# Patient Record
Sex: Female | Born: 1955
Health system: Southern US, Community
[De-identification: ages and names within clinical notes are randomized; demographics above are authoritative.]

## PROBLEM LIST (undated history)

## (undated) DIAGNOSIS — J189 Pneumonia, unspecified organism: Secondary | ICD-10-CM

## (undated) DIAGNOSIS — F32A Depression, unspecified: Secondary | ICD-10-CM

## (undated) DIAGNOSIS — M810 Age-related osteoporosis without current pathological fracture: Secondary | ICD-10-CM

## (undated) DIAGNOSIS — G4700 Insomnia, unspecified: Secondary | ICD-10-CM

## (undated) DIAGNOSIS — I10 Essential (primary) hypertension: Secondary | ICD-10-CM

## (undated) DIAGNOSIS — K429 Umbilical hernia without obstruction or gangrene: Secondary | ICD-10-CM

## (undated) DIAGNOSIS — E559 Vitamin D deficiency, unspecified: Secondary | ICD-10-CM

## (undated) DIAGNOSIS — J439 Emphysema, unspecified: Secondary | ICD-10-CM

## (undated) DIAGNOSIS — E785 Hyperlipidemia, unspecified: Secondary | ICD-10-CM

## (undated) DIAGNOSIS — E669 Obesity, unspecified: Secondary | ICD-10-CM

## (undated) DIAGNOSIS — F418 Other specified anxiety disorders: Secondary | ICD-10-CM

## (undated) DIAGNOSIS — I2510 Atherosclerotic heart disease of native coronary artery without angina pectoris: Secondary | ICD-10-CM

## (undated) DIAGNOSIS — J9611 Chronic respiratory failure with hypoxia: Secondary | ICD-10-CM

## (undated) DIAGNOSIS — J449 Chronic obstructive pulmonary disease, unspecified: Secondary | ICD-10-CM

## (undated) DIAGNOSIS — Z9889 Other specified postprocedural states: Secondary | ICD-10-CM

## (undated) DIAGNOSIS — K219 Gastro-esophageal reflux disease without esophagitis: Secondary | ICD-10-CM

## (undated) DIAGNOSIS — F329 Major depressive disorder, single episode, unspecified: Secondary | ICD-10-CM

## (undated) DIAGNOSIS — K922 Gastrointestinal hemorrhage, unspecified: Secondary | ICD-10-CM

## (undated) DIAGNOSIS — T7840XA Allergy, unspecified, initial encounter: Secondary | ICD-10-CM

## (undated) DIAGNOSIS — M5416 Radiculopathy, lumbar region: Secondary | ICD-10-CM

## (undated) DIAGNOSIS — M1611 Unilateral primary osteoarthritis, right hip: Secondary | ICD-10-CM

## (undated) DIAGNOSIS — K559 Vascular disorder of intestine, unspecified: Secondary | ICD-10-CM

## (undated) DIAGNOSIS — R011 Cardiac murmur, unspecified: Secondary | ICD-10-CM

## (undated) DIAGNOSIS — S5292XA Unspecified fracture of left forearm, initial encounter for closed fracture: Secondary | ICD-10-CM

## (undated) DIAGNOSIS — G4730 Sleep apnea, unspecified: Secondary | ICD-10-CM

## (undated) DIAGNOSIS — S5291XA Unspecified fracture of right forearm, initial encounter for closed fracture: Secondary | ICD-10-CM

## (undated) DIAGNOSIS — IMO0002 Reserved for concepts with insufficient information to code with codable children: Secondary | ICD-10-CM

## (undated) DIAGNOSIS — R112 Nausea with vomiting, unspecified: Secondary | ICD-10-CM

## (undated) HISTORY — DX: Essential (primary) hypertension: I10

## (undated) HISTORY — DX: Obesity, unspecified: E66.9

## (undated) HISTORY — DX: Radiculopathy, lumbar region: M54.16

## (undated) HISTORY — DX: Chronic respiratory failure with hypoxia: J96.11

## (undated) HISTORY — DX: Vitamin D deficiency, unspecified: E55.9

## (undated) HISTORY — PX: ABDOMINAL HYSTERECTOMY: SHX81

## (undated) HISTORY — DX: Atherosclerotic heart disease of native coronary artery without angina pectoris: I25.10

## (undated) HISTORY — DX: Chronic obstructive pulmonary disease, unspecified: J44.9

## (undated) HISTORY — PX: COLONOSCOPY: SHX174

## (undated) HISTORY — DX: Age-related osteoporosis without current pathological fracture: M81.0

## (undated) HISTORY — PX: ESOPHAGOGASTRODUODENOSCOPY: SHX1529

## (undated) HISTORY — DX: Unilateral primary osteoarthritis, right hip: M16.11

## (undated) HISTORY — DX: Allergy, unspecified, initial encounter: T78.40XA

## (undated) HISTORY — PX: BACK SURGERY: SHX140

## (undated) HISTORY — DX: Unspecified fracture of right forearm, initial encounter for closed fracture: S52.91XA

## (undated) HISTORY — DX: Unspecified fracture of left forearm, initial encounter for closed fracture: S52.92XA

## (undated) HISTORY — DX: Hyperlipidemia, unspecified: E78.5

## (undated) HISTORY — DX: Reserved for concepts with insufficient information to code with codable children: IMO0002

## (undated) HISTORY — PX: CHOLECYSTECTOMY: SHX55

## (undated) HISTORY — DX: Emphysema, unspecified: J43.9

## (undated) HISTORY — DX: Umbilical hernia without obstruction or gangrene: K42.9

## (undated) HISTORY — DX: Other specified anxiety disorders: F41.8

## (undated) HISTORY — PX: OTHER SURGICAL HISTORY: SHX169

## (undated) HISTORY — DX: Insomnia, unspecified: G47.00

---

## 1997-12-13 ENCOUNTER — Encounter: Payer: Self-pay | Admitting: Obstetrics and Gynecology

## 1997-12-16 ENCOUNTER — Ambulatory Visit (HOSPITAL_COMMUNITY): Admission: RE | Admit: 1997-12-16 | Discharge: 1997-12-16 | Payer: Self-pay | Admitting: Obstetrics and Gynecology

## 1999-04-21 ENCOUNTER — Encounter: Payer: Self-pay | Admitting: Obstetrics and Gynecology

## 1999-04-24 ENCOUNTER — Encounter (INDEPENDENT_AMBULATORY_CARE_PROVIDER_SITE_OTHER): Payer: Self-pay | Admitting: Specialist

## 1999-04-24 ENCOUNTER — Encounter (INDEPENDENT_AMBULATORY_CARE_PROVIDER_SITE_OTHER): Payer: Self-pay

## 1999-04-24 ENCOUNTER — Inpatient Hospital Stay (HOSPITAL_COMMUNITY): Admission: RE | Admit: 1999-04-24 | Discharge: 1999-04-25 | Payer: Self-pay | Admitting: Obstetrics and Gynecology

## 2001-12-04 ENCOUNTER — Encounter: Payer: Self-pay | Admitting: *Deleted

## 2001-12-04 ENCOUNTER — Ambulatory Visit (HOSPITAL_COMMUNITY): Admission: RE | Admit: 2001-12-04 | Discharge: 2001-12-04 | Payer: Self-pay | Admitting: *Deleted

## 2002-06-07 ENCOUNTER — Emergency Department (HOSPITAL_COMMUNITY): Admission: EM | Admit: 2002-06-07 | Discharge: 2002-06-07 | Payer: Self-pay | Admitting: Emergency Medicine

## 2002-06-07 ENCOUNTER — Encounter: Payer: Self-pay | Admitting: Emergency Medicine

## 2002-06-30 ENCOUNTER — Other Ambulatory Visit: Admission: RE | Admit: 2002-06-30 | Discharge: 2002-06-30 | Payer: Self-pay | Admitting: Obstetrics and Gynecology

## 2003-03-27 ENCOUNTER — Emergency Department (HOSPITAL_COMMUNITY): Admission: EM | Admit: 2003-03-27 | Discharge: 2003-03-27 | Payer: Self-pay | Admitting: Emergency Medicine

## 2003-11-30 ENCOUNTER — Ambulatory Visit: Payer: Self-pay | Admitting: Oncology

## 2004-02-11 ENCOUNTER — Ambulatory Visit (HOSPITAL_COMMUNITY): Admission: RE | Admit: 2004-02-11 | Discharge: 2004-02-11 | Payer: Self-pay | Admitting: Family Medicine

## 2005-05-01 ENCOUNTER — Inpatient Hospital Stay (HOSPITAL_COMMUNITY): Admission: EM | Admit: 2005-05-01 | Discharge: 2005-05-03 | Payer: Self-pay | Admitting: Emergency Medicine

## 2006-05-30 ENCOUNTER — Ambulatory Visit: Payer: Self-pay | Admitting: Internal Medicine

## 2006-07-17 ENCOUNTER — Encounter: Payer: Self-pay | Admitting: Internal Medicine

## 2006-07-17 ENCOUNTER — Ambulatory Visit: Payer: Self-pay | Admitting: Internal Medicine

## 2006-10-31 ENCOUNTER — Other Ambulatory Visit: Admission: RE | Admit: 2006-10-31 | Discharge: 2006-10-31 | Payer: Self-pay | Admitting: Obstetrics and Gynecology

## 2007-04-21 ENCOUNTER — Ambulatory Visit: Payer: Self-pay | Admitting: Internal Medicine

## 2007-04-21 ENCOUNTER — Inpatient Hospital Stay (HOSPITAL_COMMUNITY): Admission: EM | Admit: 2007-04-21 | Discharge: 2007-04-22 | Payer: Self-pay | Admitting: Emergency Medicine

## 2007-04-25 ENCOUNTER — Ambulatory Visit: Payer: Self-pay | Admitting: Internal Medicine

## 2007-05-07 ENCOUNTER — Ambulatory Visit: Payer: Self-pay | Admitting: Internal Medicine

## 2007-07-29 ENCOUNTER — Telehealth: Payer: Self-pay | Admitting: Internal Medicine

## 2010-02-18 ENCOUNTER — Encounter (HOSPITAL_COMMUNITY): Payer: Self-pay | Admitting: Oncology

## 2010-06-13 NOTE — H&P (Signed)
[name], [name]              ACCOUNT NO.:  [id number]   MEDICAL RECORD NO.:  [id number]          PATIENT TYPE:  INP   LOCATION:  1343                         FACILITY:  [facility]   PHYSICIAN:  Therisa Doyne, MD    DATE OF BIRTH:  [birth year]   DATE OF ADMISSION:  04/20/2007  DATE OF DISCHARGE:                              HISTORY & PHYSICAL   PRIMARY CARE PHYSICIAN:  [name], M.D.  Gastroenterologist, Dr.  Juanda Watkins.   CHIEF COMPLAINT:  Abdominal pain.   HISTORY OF PRESENT ILLNESS:  A 55 year old, white female with the past  medical history significant for colitis diagnosed approximately 1 year  ago, hypertension and tobacco abuse who presents to the emergency  department for abdominal pain.  With respect to her colitis diagnosis,  she reports one episode of abdominal pain for which she underwent  colonoscopy which was reportedly negative with the exception of  diverticuli noted.  She had a CT scan which was unremarkable.  It was  felt that this episode of colitis 1 year ago was reasonably related to  ischemic colitis.   The patient presents today with acute, lower quadrant abdominal pain.  This episode was preceded by lightheadedness and dizzy spell.  She then  developed acute left lower quadrant pain and suprapubic pain.  Her pain  has been present x18 hours and is described as a crampy sensation.  She  has had approximately 15-20 bowel movements today, mostly small volume  stools which have all been bloody in nature.  The stool is described as  being bright red blood mixed in with maroon colors.  She denies any sick  contacts and has not been on any recent antibiotics.  She denies fevers,  chills, chest pain, shortness of breath, nausea, vomiting, rashes or  lower extremity edema.   In the emergency department, the patient received normal saline, Zofran,  Dilaudid, Cipro and Flagyl.   PAST MEDICAL HISTORY:  1. Hypertension.  2. Depression.  3. Gastroesophageal  reflux disease.  4. Episode of colitis 1 year ago presumed to be ischemic in etiology.  5. Status post cholecystectomy.  6. Status post abdominal hysterectomy.   SOCIAL HISTORY:  The patient lives in [place], [place].  She  smokes a half pack of cigarettes per day for the past 20-30 years.  She  denies alcohol or drugs.   FAMILY HISTORY:  Negative for inflammatory bowel disease.   REVIEW OF SYSTEMS:  All systems reviewed and negative except for as  mentioned above in the history of present illness.   MEDICATIONS:  1. Zantac 150 mg b.i.d.  2. Nexium 40 mg daily.  3. Altace 10 mg b.i.d.  4. Verapamil 240 mg b.i.d.  5. Zoloft 50 mg daily.   ALLERGIES:  DEMEROL.   PHYSICAL EXAMINATION:  VITAL SIGNS:  Temperature 97.1, blood pressure  121/75, pulse 85, respirations 22.  Oxygen saturation 96% on room air.  GENERAL:  In no acute distress.  HEENT:  Normocephalic, atraumatic.  Pupils equal round and reactive to  light and accommodation.  Extraocular movements intact.  Oropharynx pink  and  moist without any lesions.  NECK:  Supple.  No lymphadenopathy.  No jugular venous distention.  No  masses.  CARDIAC:  Regular rate and rhythm.  No murmurs, rubs or gallops.  CHEST:  Clear to auscultation bilaterally.  ABDOMEN:  Decreased bowel sounds.  Tenderness to palpation in the left  lower quadrant.  No rebound or guarding.  No hepatosplenomegaly.  EXTREMITIES:  No clubbing, cyanosis or edema.  Dorsalis pedis pulses are  2+ bilaterally.  SKIN:  No rashes.  BACK:  No CVA tenderness.   LABORATORY DATA AND X-RAY FINDINGS:  White blood cell count 21.4 with a  left shift of 84% segmented neutrophils and 11% lymphocytes, hemoglobin  14.2, platelets 376.  BMP within normal limits.   CT scan of the abdomen and pelvis with left-sided colitis, nonspecific  in nature, however, the differential does include ischemia.  A fatty  liver was also seen.   ASSESSMENT/PLAN:  1. Will admit the patient  to [facility] service under Dr.      Aurelio Watkins care.  2. Left-sided colitis, differential includes infectious, inflammatory      and ischemia.  I question if ischemia is most likely given her      history of tobacco abuse and the fact that she is on maximum dose      of two antihypertensive medications.  She may have had a preceding      episode of transient hypotension which would account for her      dizziness and subsequently could have had ischemic colitis.  We      will give her supportive care with normal saline at 125 mL per hour      and place her on a liquid diet.  She is on empiric ciprofloxacin      and Flagyl intravenously.  Will check stool cultures to include      Clostridium difficile toxin, stool cell count, stool culture and      stool ova and parasite examination.  Will monitor the patient with      serial abdominal exam to consider gastrointestinal consultation for      possible colonoscopy.  Symptomatic treatment with morphine and      Phenergan.  3. Hypertension.  Currently, her systolic blood pressure is 110.  Will      hold her Verapamil and Altace for now as her colitis may have been      from a flow state which could have caused ischemic colitis.  4. Gastroesophageal reflux disease.  Continue Nexium and Zantac.  5. Depression.  Continue Zoloft.  6. Fluids, electrolytes and nutrition.  Normal saline at 125 mL per      hour.  Electrolytes are stable.  Liquid diet.  7. Deep venous thrombosis prophylaxis.  Bilateral pneumatic      compression hoses.     Therisa Doyne, MD  Electronically Signed    SJT/MEDQ  D:  04/21/2007  T:  04/21/2007  Job:  [id number]

## 2010-06-13 NOTE — Assessment & Plan Note (Signed)
 HEALTHCARE                         GASTROENTEROLOGY OFFICE NOTE   NAME:[name], [name]                  MRN:          [id number]  DATE:05/07/2007                            DOB:          18-Mar-1955    [name] is a 55 year old white female who is here for followup of  ischemic colitis.  She presented to the hospital on April 20, 2007 with  severe lower abdominal pain across the lower abdomen with past history  of blood per rectum and CT scan evidence of acute colitis.  The patient  has been having similar attacks on a periodic basis.  She is a smoker,  and she is on 2 antihypertensive medications.  Her ischemic colitis was  attributed to a low-flow state.  She, since then, has been much  improved, taking stool softeners, but she still has residual abdominal  discomfort in lower abdomen.  The attacks of the abdominal pain are so  severe at times that she almost passes out.  Levsin sublingually 0.125  mg which she took from her son seemed to help the pain.  She had to take  3 of them.   MEDICATIONS:  1. Verapamil 240 mg 1 p.o. b.i.d.  2. Zantac OTC 150 mg daily.  3. Levsin 0.125 mg p.r.n.  4. Nexium 40 mg daily.  5. Zoloft 50 mg daily.  6. Altace 10 mg p.o. b.i.d.   PHYSICAL EXAMINATION:  Blood pressure 118/78, pulse 68 and weight 164  pounds.  She was alert, oriented, no distress.  Lungs were clear to auscultation.  Cor with normal S1 and S2.  Abdomen was soft, mildly protuberant with horizontal scar in suprapubic  area from previous hysterectomy.  There was marked tenderness across the  lower abdomen from left to right lower quadrant without any rebound.  There was no fullness or induration.  Bowel sounds were normoactive.  Liver edge at costal margin.   IMPRESSION:  This is a 55 year old white female with history of  intermittent lower abdominal pain, constipation, and evidence of  suggestive ischemic colitis in the left colon.   Last colonoscopy in June  2008 did not show any evidence of acute colitis.  She had mild  diverticulosis of the left colon and diminutive polyp of the rectum  which were removed.   PLAN:  1. Continue Levsin 0.125 mg up to 2 at a time for intermittent      abdominal pain.  2. Stool softeners on a daily basis to prevent constipation.  3. Consider cutting back on some of her antihypertensive medications.  4. Align as a probiotic 1 p.o. daily, samples given.  5. Vicodin, generic, dispense 30, half to 1 tablet q.6 h. p.r.n.      severe lower abdominal pain.  I need to see her again in 3 months.     Hedwig Morton. Juanda Chance, MD  Electronically Signed    DMB/MedQ  DD: 05/07/2007  DT: 05/07/2007  Job #: [id number]   cc:   Dario Guardian, M.D.

## 2010-06-16 NOTE — Assessment & Plan Note (Signed)
[facility]                         GASTROENTEROLOGY OFFICE NOTE   NAME:[name], [name]                  MRN:          [id number]  DATE:05/30/2006                            DOB:          1955-06-21    [name] is a very nice 55 year old white female who comes for  evaluation of non-specific colitis.  On CT of the abdomen done in  December 2007, she had edema of the left colon suggestive of infectious,  colitis or inflammatory bowel disease.  She comes because of episodic  severe cramping abdominal pain localized to the left lower quadrant.  She had a severe episode in December and then another episode several  days ago.  With the first episode, there was some bleeding and passed  mucous.  She had a cramping abdominal pain with multiple stools of  watery consistency, with her usual bowel habits being one bowel movement  daily.  She has a history of irritable bowel syndrome and used to have  predominantly diarrhea, but most recently, more constipation.  We have  seen her in the past on multiple occasions for colonoscopy because of  positive family history of colon cancer in  her father.  Her father also  has some sort of colitis.  We did a colonoscopy in 1990, in 1991 when  she had colon polyps, subsequently in 1995 and the last colonoscopy in  August 2001.  She had adenomatous polyps in the past but not on the last  colonoscopy.  There was diverticulosis of the left colon with spasms.  On the last colonoscopy, the patient was started on Colace, and Perdiem  with Senokot 2 teaspoons daily for constipation.  Another problem has  been heartburn.  The patient is a smoker.  She is burning substernally  and was evaluated for her chest pain by cardiology.  She continues to  smoke, drinks only decaffeinated beverages.   MEDICATIONS:  1. Altace 10 mg b.i.d..  2. Verapamil 240 one b.i.d.  3. Zantac 150 mg four time   PAST MEDICAL HISTORY:  1.  Significant for hyperlipidemia.  2. High blood pressure.  3. Questionable angina.  4. Arthritic complaints.  5. Depression.  6. Blood clotting disease.  7. Cholecystectomy.  8. Hysterectomy.  9. Appendectomy.   FAMILY HISTORY:  Positive for colon cancer in her father and heart  disease in her father.   SOCIAL HISTORY:  Married with two children.  She works as a Programmer, applications.  She smokes.  Does not drink alcohol.   REVIEW OF SYSTEMS:  1. Positive for allergies.  2. Frequent cough.  3. Arthritic complaints.  4. Skin rashes.  5. Fainting spells.  6. Vision changes.   PHYSICAL EXAMINATION:  Blood pressure 108/72, pulse 80, weight 166  pounds.  The patient was alert, oriented, in no distress.  Oral cavity  was normal.  NECK:  Supple.  No lymphadenopathy.  LUNGS:  Clear to auscultation.  COR:  Normal S1, normal S2.  ABDOMEN:  Soft with tenderness along the left lower quadrant and left  middle quadrants with no rebound, no papular mass.  Right low  quadrant  was also somewhat tender but not as much as the left side. Liver edge  was at costal margin post cholecystectomy.  Scars present.  RECTAL EXAMINATION:  Revealed trace Hemoccult-positive stool.   IMPRESSION:  A 55 year old white female with episodic attacks of  abdominal pain and family history of colon cancer.  Her last colonoscopy  showed mild diverticulosis of the left colon, but previous colonoscopy  showed adenomatous polyps.  The Hemoccult-positive stool could be  related to recent diarrhea  due to rectal irritation. In the setting of  increased risk factors for colon cancer and acute abdominal pain, we  need to proceed with colonoscopy.   PLAN:  1. Colonoscopy scheduled.  2. Samples of Nexium 40 mg p.o. b.i.d.  The patient may need upper      endoscopy as well to rule out Barrett's  esophagus.  3. Levsin sublingually 0.125 mg to take twice a day.     Hedwig Morton. Juanda Chance, MD  Electronically Signed    DMB/MedQ   DD: 05/30/2006  DT: 05/30/2006  Job #: [id number]   cc:   Dario Guardian, M.D.

## 2010-06-16 NOTE — H&P (Signed)
[name], [name]              ACCOUNT NO.:  [id number]   MEDICAL RECORD NO.:  [id number]          PATIENT TYPE:  EMS   LOCATION:  ED                           FACILITY:  [facility]   PHYSICIAN:  Deirdre Peer. Polite, M.D. DATE OF BIRTH:  31-Mar-1955   DATE OF ADMISSION:  05/01/2005  DATE OF DISCHARGE:                                HISTORY & PHYSICAL   CHIEF COMPLAINT:  Nausea and vomiting.   HISTORY OF PRESENT ILLNESS:  A 55 year old female with known history of  COPD, hypertension, depression, who was sent to the ED for evaluation of a  prolonged illness characterized by nausea, vomiting, and diarrhea.  According to the patient, after a few days stress test she had above  symptoms, multiple bouts of nausea, vomiting and diarrhea.  Did note  occasional blood-tinged emesis.  Of note, the patient does have a history of  peptic ulcer disease and has been on PPI; however, she has not been able to  take it over the last couple of days because of her persistent nausea and  vomiting.  She has not been able to keep much on her stomach because of the  above symptoms.  She denies any recent antibiotics.  Does admit to having a  sick contact, her grandson, who had a GI illness. However, no one else is  sick at home.  The patient does have well water at home but, again, no one  else is sick.  Of note, the patient did have a bout with C. difficile  approximately 1 year ago when she had similar symptoms that she is  describing today.  At that time the patient had been on a course of  antibiotics.  The patient does admit to some subjective fevers, chills, and  some significant abdominal discomfort.  She does also admit to occasional  productive cough.  Denies any dysuria.  In the ED the patient was evaluated.  She was afebrile, hemodynamically stable.  Most of her labs are pending.  However, CBC does show a white count of 15,000.  Abdominal series shows  focal areas of ileus.  Chest x-ray without  infiltrate.  Admission is deemed  necessary for further evaluation and treatment.   PAST MEDICAL HISTORY:  As stated above, significant for:  1.  Hypertension.  2.  COPD.  3.  Depression.  4.  Recent evaluation for chest pain which revealed a negative stress test.  5.  History of peptic ulcer disease.  6.  History of irritable bowel syndrome characterized by diarrhea and      constipation.   MEDICATIONS ON ADMISSION:  Include Altace, verapamil, Paxil, Nexium.   SOCIAL HISTORY:  Positive for tobacco, one pack per day.  No alcohol, no  drugs.   PAST SURGICAL HISTORY:  Significant for hysterectomy approximately 4-5 years  ago secondary to a fibroid.  The patient admits to having cholecystectomy at  age 71.   ALLERGIES:  Reports DEMEROL causes nausea and vomiting.   FAMILY HISTORY:  Mother deceased from complications of lupus.  Father has  history of coronary disease and brother with cardiomyopathy.  REVIEW OF SYSTEMS:  As stated in the HPI.   PHYSICAL EXAMINATION:  GENERAL:  The patient is alert and oriented x3 in  mild to moderate distress secondary to abdominal discomfort.  VITAL SIGNS:  Temperature 97.4, blood pressure 151/105, pulse 99,  respiratory rate of 18.  HEENT:  Anicteric sclerae, moist oral mucosa.  CHEST:  Clear to auscultation bilaterally.  CARDIOVASCULAR:  Regular, no S3.  ABDOMEN:  Somewhat diffusely tender; however, appears to be more tender in  the periumbilical area.  EXTREMITIES:  No clubbing, cyanosis, or edema; 2+ pulse.  NEUROLOGIC:  Nonfocal.   ASSESSMENT:  1.  Nausea, vomiting, diarrhea for approximately 2-3 weeks.  Differential      diagnosis includes viral gastroenteritis versus peptic ulcer disease      versus infectious enteritis versus ileus.  2.  Fever at home.  3.  Chronic obstructive pulmonary disease.  4.  Hypertension.  5.  Depression.  6.  Leukocytosis of 15,000.  7.  Abnormal x-ray showing areas of focal ileus.  8.  History of  peptic ulcer disease.  9.  Recent evaluation for chest pain with a normal stress test.   Recommend the patient be admitted to a medicine floor bed.  The patient will  be provided with IV fluids.  Will obtain labs, CMET, CBC.  Because x-ray  shows focal areas of ileus will also obtain a CT.  The patient will be  pancultured, started on empiric antibiotics, placed on PPI and DVT  prophylaxis.  Will also obtain orthostatic vital signs.  Will make further  recommendations after review of the above studies.      Deirdre Peer. Polite, M.D.  Electronically Signed     RDP/MEDQ  D:  05/01/2005  T:  05/01/2005  Job:  [id number]   cc:   Dario Guardian, M.D.  Fax: [phone]

## 2010-10-24 LAB — CBC
HCT: 35.1 — ABNORMAL LOW
HCT: 40.7
Hemoglobin: 12.1
Hemoglobin: 14.2
MCHC: 35.1
MCV: 90.8
MCV: 90.8
MCV: 90.9
Platelets: 310
Platelets: 376
RBC: 3.65 — ABNORMAL LOW
RBC: 3.86 — ABNORMAL LOW
RDW: 13
RDW: 13
WBC: 11.6 — ABNORMAL HIGH
WBC: 21.4 — ABNORMAL HIGH

## 2010-10-24 LAB — LUPUS ANTICOAGULANT PANEL
DRVVT: 46.5 (ref 36.1–47.0)
Lupus Anticoagulant: NOT DETECTED
PTTLA 4:1 Mix: 52.8 — ABNORMAL HIGH (ref 36.3–48.8)
PTTLA Confirmation: 10.6 — ABNORMAL HIGH (ref <8.0)

## 2010-10-24 LAB — COMPREHENSIVE METABOLIC PANEL
AST: 19
CO2: 27
Calcium: 7.9 — ABNORMAL LOW
Creatinine, Ser: 0.74
GFR calc Af Amer: >60
GFR calc non Af Amer: >60
Glucose, Bld: 90
Total Protein: 5.3 — ABNORMAL LOW

## 2010-10-24 LAB — OVA AND PARASITE EXAMINATION

## 2010-10-24 LAB — URINALYSIS, ROUTINE W REFLEX MICROSCOPIC
Glucose, UA: NEGATIVE
Hgb urine dipstick: NEGATIVE
Protein, ur: NEGATIVE
Specific Gravity, Urine: 1.026
Urobilinogen, UA: 0.2
pH: 5.5

## 2010-10-24 LAB — STOOL CULTURE

## 2010-10-24 LAB — BASIC METABOLIC PANEL
Calcium: 8.9
Creatinine, Ser: 0.72
GFR calc non Af Amer: >60

## 2010-10-24 LAB — DIFFERENTIAL
Lymphocytes Relative: 11 — ABNORMAL LOW
Monocytes Absolute: 1.1 — ABNORMAL HIGH
Neutro Abs: 18 — ABNORMAL HIGH

## 2010-10-24 LAB — PROTEIN S ACTIVITY: Protein S Activity: 80 % (ref 69–129)

## 2010-10-24 LAB — CARDIOLIPIN ANTIBODIES, IGG, IGM, IGA
Anticardiolipin IgA: 19 (ref <13)
Anticardiolipin IgG: 12 (ref <11)

## 2010-10-24 LAB — CLOSTRIDIUM DIFFICILE EIA: C difficile Toxins A+B, EIA: NEGATIVE

## 2010-10-24 LAB — PROTEIN C, TOTAL: Protein C, Total: 85 % (ref 70–140)

## 2010-10-24 LAB — PROTEIN S, TOTAL: Protein S Ag, Total: 93 % (ref 70–140)

## 2011-04-12 ENCOUNTER — Encounter: Payer: Self-pay | Admitting: Internal Medicine

## 2012-05-02 ENCOUNTER — Encounter: Payer: Self-pay | Admitting: Internal Medicine

## 2012-11-04 ENCOUNTER — Ambulatory Visit (INDEPENDENT_AMBULATORY_CARE_PROVIDER_SITE_OTHER): Payer: PRIVATE HEALTH INSURANCE | Admitting: Podiatrist

## 2012-11-04 ENCOUNTER — Encounter: Payer: Self-pay | Admitting: Podiatrist

## 2012-11-04 VITALS — BP 101/55 | HR 76 | Ht 64.0 in | Wt 192.0 lb

## 2012-11-04 DIAGNOSIS — L600 Ingrowing nail: Secondary | ICD-10-CM

## 2012-11-04 NOTE — Progress Notes (Signed)
  Chief Complaint  Patient presents with  . Ingrown Toenail    left big toe     HPI: Patient is 57 y.o. female who presents today for painful left great toenail medial border.  Patient's husband tries to trim it out but it has recently become swollen, tender, and sore.  Patient states it did have pus however that has resolved.   Review of Systems  DATA OBTAINED: from patient GENERAL: Feels well no fevers, no fatigue, no changes in appetite SKIN: No itching, no rashes, no open lesions, no wounds EYES: No eye pain,no redness, no discharge EARS: No earache,no ringing of ears, no recent change in hearing NOSE: No congestion, no drainage, no bleeding  MOUTH/THROAT: No mouth pain, No sore throat, No difficulty chewing or swallowing  RESPIRATORY: No cough, no wheezing, no SOB CARDIAC: No chest pain,no heart palpitations,no new onset lower extremity edema  GI: No abdominal pain, No Nausea, no vomiting, no diarrhea, no heartburn or no reflux  GU: No dysuria, no increased frequency or urgency MUSCULOSKELETAL: No unrelieved bone/joint pain,  NEUROLOGIC: Awake, alert, appropriate to situation, No change in mental status. PSYCHIATRIC: No overt anxiety or sadness.No behavior issue.  AMBULATION:  Ambulates unassisted    Physical Exam  GENERAL APPEARANCE: Alert, conversant. Appropriately groomed. No acute distress.  VASCULAR: Pedal pulses palpable and strong bilateral.  Capillary refill time is immediate to all digits,  Proximal to distal cooling it warm to warm.  Digital hair growth is present bilateral  NEUROLOGIC: sensation is intact epicritically and protectively to 5.07 monofilament at 5/5 sites bilateral.  Light touch is intact bilateral, vibratory sensation intact bilateral, achilles tendon reflex is intact bilateral.  MUSCULOSKELETAL: acceptable muscle strength, tone and stability bilateral.  Intrinsic muscluature intact bilateral.  Rectus appearance of foot and digits noted bilateral.    DERMATOLOGIC: skin color, texture, and turger are within normal limits.  No preulcerative lesions are seen, no interdigital maceration noted.  No open lesions present.  Digital nails are asymptomatic. With the exception of the left hallux nail which has a pincer type nail deformity present.  Minimal redness along medial nail border is seen.  No drainange or pus seen.   Assessment   Ingrown Nail - Left first Medial border  Plan  Treatment options and alternatives discussed.  Recommended permanent phenol matrixectomy and patient agreed left was prepped with alcohol and a 1 to 1 mix of 0.5% marcaine plain and 2% lidocaine plain was administered in a digital block fashion.  The toe was then prepped with betadine solution and exsanguinated.  The offending nail border was then excised and matrix tissue exposed.  Phenol was then applied to the matrix tissue followed by an alcohol wash.  Antibiotic ointment and a dry sterile dressing was applied.  The patient was dispensed instructions for aftercare.    Delories Heinz, DPM

## 2012-11-04 NOTE — Patient Instructions (Addendum)
ANTIBACTERIAL SOAP INSTRUCTIONS  THE DAY AFTER PROCEDURE  Please follow the instructions your doctor has marked.   Shower as usual. Before getting out, place a drop of antibacterial liquid soap (Dial) on a wet, clean washcloth.  Gently wipe washcloth over affected area.  Afterward, rinse the area with warm water.  Blot the area dry with a soft cloth and cover with antibiotic ointment (neosporin, polysporin, bacitracin) and band aid or gauze and tape  OR    Place 3-4 drops of antibacterial liquid soap in a quart of warm tap water.  Submerge foot into water for 20 minutes.  If bandage was applied after your procedure, leave on to allow for easy lift off, then remove and continue with soak for the remaining time.  Next, blot area dry with a soft cloth and cover with a bandage.  Apply other medications as directed by your doctor, such as cortisporin otic solution (eardrops) or neosporin antibiotic ointment   Call if nail becomes red, swollen or draining pus or if you have moderate to severe pain after the procedure

## 2012-11-25 ENCOUNTER — Ambulatory Visit: Payer: PRIVATE HEALTH INSURANCE | Admitting: Podiatrist

## 2012-12-02 ENCOUNTER — Ambulatory Visit: Payer: PRIVATE HEALTH INSURANCE | Admitting: Podiatrist

## 2013-01-07 ENCOUNTER — Encounter: Payer: Self-pay | Admitting: Internal Medicine

## 2013-03-21 ENCOUNTER — Emergency Department (HOSPITAL_COMMUNITY): Payer: PRIVATE HEALTH INSURANCE

## 2013-03-21 ENCOUNTER — Inpatient Hospital Stay (HOSPITAL_COMMUNITY)
Admission: EM | Admit: 2013-03-21 | Discharge: 2013-03-23 | DRG: 395 | Disposition: A | Discharge status: Home / Self Care | Payer: PRIVATE HEALTH INSURANCE | Attending: Family Medicine | Admitting: Family Medicine

## 2013-03-21 ENCOUNTER — Encounter (HOSPITAL_COMMUNITY): Payer: Self-pay | Admitting: Emergency Medicine

## 2013-03-21 DIAGNOSIS — F17200 Nicotine dependence, unspecified, uncomplicated: Secondary | ICD-10-CM | POA: Diagnosis present

## 2013-03-21 DIAGNOSIS — Z833 Family history of diabetes mellitus: Secondary | ICD-10-CM

## 2013-03-21 DIAGNOSIS — K55059 Acute (reversible) ischemia of intestine, part and extent unspecified: Principal | ICD-10-CM | POA: Diagnosis present

## 2013-03-21 DIAGNOSIS — K559 Vascular disorder of intestine, unspecified: Secondary | ICD-10-CM

## 2013-03-21 DIAGNOSIS — Z8249 Family history of ischemic heart disease and other diseases of the circulatory system: Secondary | ICD-10-CM

## 2013-03-21 DIAGNOSIS — K551 Chronic vascular disorders of intestine: Secondary | ICD-10-CM | POA: Diagnosis present

## 2013-03-21 DIAGNOSIS — Z888 Allergy status to other drugs, medicaments and biological substances status: Secondary | ICD-10-CM

## 2013-03-21 DIAGNOSIS — Z79899 Other long term (current) drug therapy: Secondary | ICD-10-CM

## 2013-03-21 DIAGNOSIS — J438 Other emphysema: Secondary | ICD-10-CM | POA: Diagnosis present

## 2013-03-21 DIAGNOSIS — I10 Essential (primary) hypertension: Secondary | ICD-10-CM | POA: Diagnosis present

## 2013-03-21 DIAGNOSIS — R7989 Other specified abnormal findings of blood chemistry: Secondary | ICD-10-CM

## 2013-03-21 DIAGNOSIS — E785 Hyperlipidemia, unspecified: Secondary | ICD-10-CM | POA: Diagnosis present

## 2013-03-21 DIAGNOSIS — E872 Acidosis, unspecified: Secondary | ICD-10-CM

## 2013-03-21 DIAGNOSIS — R55 Syncope and collapse: Secondary | ICD-10-CM

## 2013-03-21 DIAGNOSIS — Z823 Family history of stroke: Secondary | ICD-10-CM

## 2013-03-21 DIAGNOSIS — K219 Gastro-esophageal reflux disease without esophagitis: Secondary | ICD-10-CM | POA: Diagnosis present

## 2013-03-21 DIAGNOSIS — R109 Unspecified abdominal pain: Secondary | ICD-10-CM

## 2013-03-21 DIAGNOSIS — F3289 Other specified depressive episodes: Secondary | ICD-10-CM | POA: Diagnosis present

## 2013-03-21 DIAGNOSIS — Z800 Family history of malignant neoplasm of digestive organs: Secondary | ICD-10-CM

## 2013-03-21 DIAGNOSIS — F329 Major depressive disorder, single episode, unspecified: Secondary | ICD-10-CM | POA: Diagnosis present

## 2013-03-21 HISTORY — DX: Vascular disorder of intestine, unspecified: K55.9

## 2013-03-21 LAB — HEPATIC FUNCTION PANEL
ALBUMIN: 3.7 g/dL (ref 3.5–5.2)
ALK PHOS: 101 U/L (ref 39–117)
ALT: 71 U/L — AB (ref 0–35)
AST: 44 U/L — AB (ref 0–37)
BILIRUBIN TOTAL: 0.4 mg/dL (ref 0.3–1.2)
Total Protein: 6.8 g/dL (ref 6.0–8.3)

## 2013-03-21 LAB — I-STAT CHEM 8, ED
BUN: 18 mg/dL (ref 6–23)
CHLORIDE: 104 meq/L (ref 96–112)
CREATININE: 1 mg/dL (ref 0.50–1.10)
Calcium, Ion: 1.23 mmol/L (ref 1.12–1.23)
GLUCOSE: 110 mg/dL — AB (ref 70–99)
HCT: 49 % — ABNORMAL HIGH (ref 36.0–46.0)
HEMOGLOBIN: 16.7 g/dL — AB (ref 12.0–15.0)
POTASSIUM: 4.1 meq/L (ref 3.7–5.3)
Sodium: 142 mEq/L (ref 137–147)
TCO2: 28 mmol/L (ref 0–100)

## 2013-03-21 LAB — URINALYSIS, ROUTINE W REFLEX MICROSCOPIC
BILIRUBIN URINE: NEGATIVE
GLUCOSE, UA: NEGATIVE mg/dL
HGB URINE DIPSTICK: NEGATIVE
KETONES UR: NEGATIVE mg/dL
Leukocytes, UA: NEGATIVE
Nitrite: NEGATIVE
PH: 5 (ref 5.0–8.0)
Protein, ur: NEGATIVE mg/dL
SPECIFIC GRAVITY, URINE: 1.019 (ref 1.005–1.030)
Urobilinogen, UA: 1 mg/dL (ref 0.0–1.0)

## 2013-03-21 LAB — I-STAT TROPONIN, ED
TROPONIN I, POC: 0.01 ng/mL (ref 0.00–0.08)
Troponin i, poc: 0 ng/mL (ref 0.00–0.08)

## 2013-03-21 LAB — POC OCCULT BLOOD, ED: FECAL OCCULT BLD: NEGATIVE

## 2013-03-21 LAB — LIPASE, BLOOD: Lipase: 21 U/L (ref 11–59)

## 2013-03-21 LAB — I-STAT CG4 LACTIC ACID, ED: Lactic Acid, Venous: 3.41 mmol/L — ABNORMAL HIGH (ref 0.5–2.2)

## 2013-03-21 MED ORDER — MORPHINE SULFATE 4 MG/ML IJ SOLN
4.0000 mg | Freq: Once | INTRAMUSCULAR | Status: AC
  Administered 2013-03-21: 4 mg via INTRAVENOUS
  Filled 2013-03-21: qty 1

## 2013-03-21 MED ORDER — HEPARIN SODIUM (PORCINE) 5000 UNIT/ML IJ SOLN
5000.0000 [IU] | Freq: Three times a day (TID) | INTRAMUSCULAR | Status: DC
  Administered 2013-03-21 – 2013-03-22 (×2): 5000 [IU] via SUBCUTANEOUS
  Filled 2013-03-21 (×5): qty 1

## 2013-03-21 MED ORDER — SODIUM CHLORIDE 0.9 % IJ SOLN
3.0000 mL | Freq: Two times a day (BID) | INTRAMUSCULAR | Status: DC
  Administered 2013-03-21 – 2013-03-22 (×2): 3 mL via INTRAVENOUS

## 2013-03-21 MED ORDER — ONDANSETRON HCL 4 MG/2ML IJ SOLN
4.0000 mg | Freq: Once | INTRAMUSCULAR | Status: AC
  Administered 2013-03-21: 4 mg via INTRAVENOUS
  Filled 2013-03-21: qty 2

## 2013-03-21 MED ORDER — CIPROFLOXACIN IN D5W 400 MG/200ML IV SOLN
400.0000 mg | Freq: Two times a day (BID) | INTRAVENOUS | Status: DC
  Administered 2013-03-22 – 2013-03-23 (×4): 400 mg via INTRAVENOUS
  Filled 2013-03-21 (×5): qty 200

## 2013-03-21 MED ORDER — PANTOPRAZOLE SODIUM 40 MG PO TBEC
40.0000 mg | DELAYED_RELEASE_TABLET | Freq: Every day | ORAL | Status: DC
  Administered 2013-03-22 – 2013-03-23 (×2): 40 mg via ORAL
  Filled 2013-03-21 (×2): qty 1

## 2013-03-21 MED ORDER — ATORVASTATIN CALCIUM 40 MG PO TABS
40.0000 mg | ORAL_TABLET | Freq: Every day | ORAL | Status: DC
Start: 2013-03-22 — End: 2013-03-23
  Administered 2013-03-22: 40 mg via ORAL
  Filled 2013-03-21 (×2): qty 1

## 2013-03-21 MED ORDER — ONDANSETRON HCL 4 MG/2ML IJ SOLN: 4.0000 mg | Freq: Three times a day (TID) | INTRAMUSCULAR | Status: DC | PRN

## 2013-03-21 MED ORDER — ALBUTEROL SULFATE (2.5 MG/3ML) 0.083% IN NEBU
5.0000 mg | INHALATION_SOLUTION | Freq: Four times a day (QID) | RESPIRATORY_TRACT | Status: DC | PRN
  Administered 2013-03-22: 5 mg via RESPIRATORY_TRACT
  Filled 2013-03-21: qty 6

## 2013-03-21 MED ORDER — ZOLPIDEM TARTRATE 5 MG PO TABS
5.0000 mg | ORAL_TABLET | Freq: Every evening | ORAL | Status: DC | PRN
  Administered 2013-03-21 – 2013-03-22 (×2): 5 mg via ORAL
  Filled 2013-03-21 (×2): qty 1

## 2013-03-21 MED ORDER — METRONIDAZOLE IN NACL 5-0.79 MG/ML-% IV SOLN
500.0000 mg | Freq: Three times a day (TID) | INTRAVENOUS | Status: DC
  Administered 2013-03-21 – 2013-03-23 (×5): 500 mg via INTRAVENOUS
  Filled 2013-03-21 (×7): qty 100

## 2013-03-21 MED ORDER — ACETAMINOPHEN 650 MG RE SUPP: 650.0000 mg | Freq: Four times a day (QID) | RECTAL | Status: DC | PRN

## 2013-03-21 MED ORDER — CITALOPRAM HYDROBROMIDE 20 MG PO TABS
20.0000 mg | ORAL_TABLET | Freq: Every day | ORAL | Status: DC
  Administered 2013-03-22 – 2013-03-23 (×2): 20 mg via ORAL
  Filled 2013-03-21 (×2): qty 1

## 2013-03-21 MED ORDER — SODIUM CHLORIDE 0.9 % IV BOLUS (SEPSIS)
500.0000 mL | Freq: Once | INTRAVENOUS | Status: AC
  Administered 2013-03-21: 500 mL via INTRAVENOUS

## 2013-03-21 MED ORDER — IOHEXOL 300 MG/ML  SOLN
100.0000 mL | Freq: Once | INTRAMUSCULAR | Status: AC | PRN
  Administered 2013-03-21: 100 mL via INTRAVENOUS

## 2013-03-21 MED ORDER — SODIUM CHLORIDE 0.9 % IV BOLUS (SEPSIS)
1000.0000 mL | Freq: Once | INTRAVENOUS | Status: AC
  Administered 2013-03-21: 1000 mL via INTRAVENOUS

## 2013-03-21 MED ORDER — ACETAMINOPHEN 325 MG PO TABS: 650.0000 mg | ORAL_TABLET | Freq: Four times a day (QID) | ORAL | Status: DC | PRN

## 2013-03-21 MED ORDER — MORPHINE SULFATE 2 MG/ML IJ SOLN
1.0000 mg | INTRAMUSCULAR | Status: DC | PRN
  Administered 2013-03-21 – 2013-03-22 (×3): 1 mg via INTRAVENOUS
  Filled 2013-03-21 (×3): qty 1

## 2013-03-21 MED ORDER — DEXTROSE-NACL 5-0.9 % IV SOLN
INTRAVENOUS | Status: DC
  Administered 2013-03-21: 125 mL/h via INTRAVENOUS

## 2013-03-21 NOTE — ED Notes (Signed)
Admitting MD at bedside.

## 2013-03-21 NOTE — ED Notes (Signed)
Dr.Arrieta given results of Istat Lactic Acid POC. ED-Lab

## 2013-03-21 NOTE — ED Provider Notes (Signed)
CSN: [id number]     Arrival date & time 03/21/13  1619 History   First MD Initiated Contact with Patient 03/21/13 1626     Chief Complaint  Patient presents with  . Loss of Consciousness  . Hypotension      Patient is a 58 y.o. female presenting with syncope.  Loss of Consciousness Episode history:  Single Most recent episode:  Today Duration: unknown. Timing:  Constant Progression:  Resolved Chronicity:  New Context: bowel movement   Context: not blood draw, not dehydration, not exertion, not inactivity, not medication change, not with normal activity, not sight of blood, not sitting down, not standing up and not urination   Witnessed: no   Relieved by:  None tried Worsened by:  Nothing tried Ineffective treatments:  None tried Associated symptoms: nausea   Associated symptoms: no anxiety, no chest pain, no confusion, no diaphoresis, no difficulty breathing, no dizziness, no fever, no focal sensory loss, no focal weakness, no headaches, no malaise/fatigue, no palpitations, no recent fall, no recent injury, no recent surgery, no rectal bleeding, no seizures, no shortness of breath, no visual change, no vomiting and no weakness       Past Medical History  Diagnosis Date  . Allergy     sinus problems  . Osteoporosis   . Intentional weight change   . Hypertension   . Emphysema of lung   . Ischemic colitis    Past Surgical History  Procedure Laterality Date  . Abdominal hysterectomy    . Back surgery      c-3 and c-4   Family History  Problem Relation Age of Onset  . Lupus Mother   . Diabetes Father   . Hypertension Father   . Hypertension Brother   . Colon cancer Paternal Grandfather   . Stroke Paternal Grandfather    History  Substance Use Topics  . Smoking status: Current Every Day Smoker  . Smokeless tobacco: Never Used     Comment: uses a vapor cigarette  . Alcohol Use: No   OB History   Grav Para Term Preterm Abortions TAB SAB Ect Mult Living                  Review of Systems  Constitutional: Negative for fever, malaise/fatigue and diaphoresis.  Respiratory: Negative for shortness of breath.   Cardiovascular: Positive for syncope. Negative for chest pain and palpitations.  Gastrointestinal: Positive for nausea and abdominal pain. Negative for vomiting.  Neurological: Positive for syncope. Negative for dizziness, focal weakness, seizures, weakness and headaches.  Psychiatric/Behavioral: Negative for confusion.      Allergies  Demerol  Home Medications   Current Outpatient Rx  Name  Route  Sig  Dispense  Refill  . albuterol (PROVENTIL) (2.5 MG/3ML) 0.083% nebulizer solution   Nebulization   Take 2.5 mg by nebulization every 6 (six) hours as needed for wheezing.         . Ascorbic Acid (VITAMIN C) 1000 MG tablet   Oral   Take 1,000 mg by mouth daily.         . Calcium Carb-Cholecalciferol (CALCIUM 600 + D) 600-200 MG-UNIT TABS   Oral   Take 1 tablet by mouth daily.         . citalopram (CELEXA) 20 MG tablet   Oral   Take 20 mg by mouth daily.         Marland Kitchen esomeprazole (NEXIUM) 40 MG capsule   Oral   Take 40 mg by mouth  daily at 12 noon.         . fish oil-omega-3 fatty acids 1000 MG capsule   Oral   Take 2 g by mouth daily.         Marland Kitchen. HYDROcodone-acetaminophen (NORCO/VICODIN) 5-325 MG per tablet   Oral   Take 1 tablet by mouth every 6 (six) hours as needed for moderate pain (abdominal pain).         . metoprolol (LOPRESSOR) 50 MG tablet   Oral   Take 50 mg by mouth 2 (two) times daily.         . nitroGLYCERIN (NITROSTAT) 0.4 MG SL tablet   Sublingual   Place 0.4 mg under the tongue every 5 (five) minutes as needed for chest pain.         . ramipril (ALTACE) 10 MG capsule   Oral   Take 10 mg by mouth 2 (two) times daily.         . ranitidine (ZANTAC) 150 MG tablet   Oral   Take 150 mg by mouth at bedtime.          . rosuvastatin (CRESTOR) 10 MG tablet   Oral   Take 10 mg by mouth  daily.         . verapamil (COVERA HS) 240 MG (CO) 24 hr tablet   Oral   Take 240 mg by mouth 2 (two) times daily.          Marland Kitchen. zolpidem (AMBIEN) 5 MG tablet   Oral   Take 5 mg by mouth at bedtime as needed for sleep.          BP 96/62  Pulse 79  Temp(Src) 97.4 F (36.3 C) (Oral)  Resp 22  SpO2 93% Physical Exam  Constitutional: She is oriented to person, place, and time. She appears well-developed and well-nourished. No distress.  HENT:  Head: Normocephalic and atraumatic.  Nose: Nose normal.  Mouth/Throat: Oropharynx is clear and moist.  Eyes: EOM are normal. Pupils are equal, round, and reactive to light.  Neck: Normal range of motion. Neck supple. No tracheal deviation present.  Cardiovascular: Normal rate, regular rhythm, normal heart sounds and intact distal pulses.   Pulmonary/Chest: Effort normal and breath sounds normal. She has no rales.  Abdominal: She exhibits distension. There is tenderness. There is no rebound and no guarding.  Musculoskeletal: Normal range of motion. She exhibits no tenderness.  Neurological: She is alert and oriented to person, place, and time.  Skin: Skin is warm and dry. No rash noted.  Psychiatric: She has a normal mood and affect. Her behavior is normal.    ED Course  Procedures (including critical care time) Labs Review  Results for orders placed during the hospital encounter of 03/21/13  HEPATIC FUNCTION PANEL      Result Value Ref Range   Total Protein 6.8  6.0 - 8.3 g/dL   Albumin 3.7  3.5 - 5.2 g/dL   AST 44 (*) 0 - 37 U/L   ALT 71 (*) 0 - 35 U/L   Alkaline Phosphatase 101  39 - 117 U/L   Total Bilirubin 0.4  0.3 - 1.2 mg/dL   Bilirubin, Direct <1.6<0.2  0.0 - 0.3 mg/dL   Indirect Bilirubin NOT CALCULATED  0.3 - 0.9 mg/dL  LIPASE, BLOOD      Result Value Ref Range   Lipase 21  11 - 59 U/L  URINALYSIS, ROUTINE W REFLEX MICROSCOPIC      Result Value Ref Range  Color, Urine YELLOW  YELLOW   APPearance CLEAR  CLEAR    Specific Gravity, Urine 1.019  1.005 - 1.030   pH 5.0  5.0 - 8.0   Glucose, UA NEGATIVE  NEGATIVE mg/dL   Hgb urine dipstick NEGATIVE  NEGATIVE   Bilirubin Urine NEGATIVE  NEGATIVE   Ketones, ur NEGATIVE  NEGATIVE mg/dL   Protein, ur NEGATIVE  NEGATIVE mg/dL   Urobilinogen, UA 1.0  0.0 - 1.0 mg/dL   Nitrite NEGATIVE  NEGATIVE   Leukocytes, UA NEGATIVE  NEGATIVE  I-STAT CHEM 8, ED      Result Value Ref Range   Sodium 142  137 - 147 mEq/L   Potassium 4.1  3.7 - 5.3 mEq/L   Chloride 104  96 - 112 mEq/L   BUN 18  6 - 23 mg/dL   Creatinine, Ser 1.61  0.50 - 1.10 mg/dL   Glucose, Bld 096 (*) 70 - 99 mg/dL   Calcium, Ion 0.45  4.09 - 1.23 mmol/L   TCO2 28  0 - 100 mmol/L   Hemoglobin 16.7 (*) 12.0 - 15.0 g/dL   HCT 81.1 (*) 91.4 - 78.2 %  I-STAT CG4 LACTIC ACID, ED      Result Value Ref Range   Lactic Acid, Venous 3.41 (*) 0.5 - 2.2 mmol/L  I-STAT TROPOININ, ED      Result Value Ref Range   Troponin i, poc 0.00  0.00 - 0.08 ng/mL   Comment 3           POC OCCULT BLOOD, ED      Result Value Ref Range   Fecal Occult Bld NEGATIVE  NEGATIVE    Imaging Review Ct Abdomen Pelvis W Contrast  03/21/2013   CLINICAL DATA:  Abdominal pain.  Ischemic colitis.  Hypotension.  EXAM: CT ABDOMEN AND PELVIS WITH CONTRAST  TECHNIQUE: Multidetector CT imaging of the abdomen and pelvis was performed using the standard protocol following bolus administration of intravenous contrast.  CONTRAST:  OMNIPAQUE IOHEXOL 300 MG/ML  SOLN  COMPARISON:  05/15/2010  FINDINGS: Moderate to severe hepatic steatosis again demonstrated. No liver masses are identified. Gallbladder is absent. The pancreas, spleen, adrenal glands, and kidneys are normal in appearance. No evidence of hydronephrosis.  No soft tissue masses or lymphadenopathy identified within the abdomen or pelvis. Prior hysterectomy noted. Adnexal regions are unremarkable.  Moderate colonic wall thickening is seen involving the splenic flexure and descending  colon, which is similar in appearance to previous study. This is consistent with colitis, induced droop creation is consistent with ischemic colitis. No other inflammatory process or abnormal fluid collections identified.  IMPRESSION: Stable appearance of colitis involving the splenic flexure and descending colon. This distribution is consistent with ischemic colitis. No other complication identified.  Stable diffuse hepatic steatosis.   Electronically Signed   By: Myles Rosenthal M.D.   On: 03/21/2013 19:30   Dg Chest Portable 1 View  03/21/2013   CLINICAL DATA:  Unresponsive.  EXAM: PORTABLE CHEST - 1 VIEW  COMPARISON:  06/10/2009 .  FINDINGS: Mediastinum and hilar structures are normal. Lungs are clear. Heart size normal. No pleural effusion or pneumothorax. Chest is stable from prior study .  IMPRESSION: No active disease.   Electronically Signed   By: Maisie Fus  Register   On: 03/21/2013 17:37    MDM   Final diagnoses:  Abdominal pain  Ischemic colitis  Elevated lactic acid level    59 yo F presented via EMS after having a syncopal episode.  Patient reports a hx of recurrent ischemic colitis. [place] had sudden onset diffuse abd pain with urge to defecate. She made it to the rest room and while sitting on the commode felt lightheaded and laid on the ground prior to having LOC. She was found by passerby who called EMS. She denies any palpitations, CP, HA prior to the event but did have adb pain.   Will control pain with morphine. NS bolus given. No sxs of infection at this time will not give abx.   8:09 PM CT scan with stable Stable appearance of colitis involving the splenic flexure and descending colon. This distribution is consistent with ischemic colitis. No other complication identified.  Case discussed with my attending Dr. Ethelda Chick. Case discussed with Family medicine team patient admitted for further care.   At transfer patient is HDS.       Nadara Mustard,  MD 03/21/13 2012

## 2013-03-21 NOTE — ED Notes (Signed)
Pt from [employer] via [facility].  Pt was found unresponsive in the bathroom.  Pt has a hx of ischemic colitis and states every time she goes to the bathroom, she feels faint.  Initial BP on scene was 80 palpated, HR thready and 65, and SpO2 85 on RA.

## 2013-03-21 NOTE — ED Provider Notes (Signed)
The patient had a syncopal or near syncopal event today. She complained of abdominal pain after bowel movement onset this morning. She gets a dull pain daily with bowel movements which she states is secondary to ischemic colitis. Presently abdominal pain is severe, diffuse. EMS treated patient with a provided normal saline she received saline 500 mL intravenously in the field. And placed on nonrebreather mask pulse oximetry in the field was 85% on room air. On exam patient appears uncomfortable Glasgow Coma Score 15 HEENT exam is diminished dry pink nonicteric neck supple lungs clear auscultation abdomen obese diffuse tenderness extremities no edema or vascular intact skin warm dry  Date: 03/21/2013  Rate: 75  Rhythm: normal sinus rhythm  QRS Axis: normal  Intervals: normal  ST/T Wave abnormalities: nonspecific T wave changes  Conduction Disutrbances:none  Narrative Interpretation:   Old EKG Reviewed: No significant change from 04/21/1999 interpreted by me   Doug SouSam Persephone Schriever, MD 03/22/13 [phone]

## 2013-03-21 NOTE — H&P (Signed)
Family Medicine Teaching [facility] Admission History and Physical Service Pager: [phone]  Patient name: [name] Medical record number: [id number] Date of birth: 1955/04/25 Age: 58 y.o. Gender: female  Primary Care Provider: Ailene Ravel, MD Consultants: None Code Status: Full Code  Chief Complaint: Near syncope, Abdominal pain  Assessment and Plan: [name] is a 58 y.o. female presenting with presyncope secondary to exacerbation of colonic ischemia. PMH is significant for COPD/emphysema, hypertension, tobacco abuse, hyperlipidemia, GERD, and ischemic colitis.  Presyncope, Colonic ischemia - presyncope likely secondary to increased vagal tone in the setting of severe pain from acute exacerbation of colonic ischemia.  Increased vagal tone also likely from straining to have a bowel movement. - Admit to telemetry for close monitoring. - NPO for bowel rest; will plan to start diet tomorrow. - Patient given 1 L NS bolus in the ED; Will order additional bolus (500 mL NS) and start D5 NS @ 125 mL/hr - IV Cipro/Flagyl to cover for potential intra-abdominal infection in setting of colonic ischemia  - Morphine PRN pain - Will consider GI consult during admission; although I'm not sure if they will have further intervention/therapies to offer patient.  COPD/Emphysema - No evidence of exacerbation currently. - PRN Albuterol   HTN - Patient with low BP in the ED.  BP currently 108/63. - Holding antihypertensives at this time, especially in the setting of colonic ischemia to ensure perfusion of the gut.  Hyperlipidemia - Holding statin until diet is started.   GERD - Protonix  Tobacco abuse -  Cessation counseling during admission.  Psych - Patient on Celexa - Will hold until diet is started.  FEN/GI: IV fluids as above; NPO Prophylaxis: Heparin  Disposition: Pending clinical improvement.  History of Present Illness:  [name] is a 58  y.o. female with a PMH of COPD/emphysema, hypertension, tobacco abuse, hyperlipidemia, GERD, and ischemic colitis who presents to the ED following a near syncopal event earlier today.   Patient reports that she has had ischemic colitis for approximately 5-6 years. This afternoon, she was at a bouncy house with her grandkids and developed sudden onset severe left lower quadrant pain.  She states that this is characteristic of her flares of ischemic colitis.  She subsequently went to the restroom to have a bowel movement and became diaphoretic, nauseated, lightheaded, and felt as if she was going to pass out.  In order to prevent this, she lied down on the bathroom floor.  She was suddenly found by a bystander who called EMS. Patient reports that she did not pass out and was awake the entire time. She does state that she was in excruciating pain and was not conversant (but states that she was aware of the events).    Patient denies any associated shortness of breath, chest pain, palpitations.  No witnessed or reported seizure activity. No recent fevers, chills.  Patient did have an episode of emesis which was nonbloody and nonbilious.    In the ED, patient was found to have a lactic acid of 3.41.  CT abdomen/pelvis was obtained and revealed stable appearance of colitis involving the splenic flexure and descending colon (consistent with ischemic colitis).  Patient was treated with IV fluids and IV morphine with improvement in pain.    Of note, patient had episode of hematochezia in the ED.  Review Of Systems: Per HPI. Otherwise 12 point review of systems was performed and was unremarkable.  Past Medical History: Past Medical History  Diagnosis  Date  . Allergy     sinus problems  . Osteoporosis   . Intentional weight change   . Hypertension   . Emphysema of lung   . Ischemic colitis    Past Surgical History: Past Surgical History  Procedure Laterality Date  . Abdominal hysterectomy    . Back  surgery      c-3 and c-4   Social History: History  Substance Use Topics  . Smoking status: Current Every Day Smoker  . Smokeless tobacco: Never Used     Comment: uses a vapor cigarette  . Alcohol Use: No   Family History: Family History  Problem Relation Age of Onset  . Lupus Mother   . Diabetes Father   . Hypertension Father   . Hypertension Brother   . Colon cancer Paternal Grandfather   . Stroke Paternal Grandfather    Allergies and Medications: Allergies  Allergen Reactions  . Demerol [Meperidine] Other (See Comments)    "quit breathing"   No current facility-administered medications on file prior to encounter.   Current Outpatient Prescriptions on File Prior to Encounter  Medication Sig Dispense Refill  . albuterol (PROVENTIL) (2.5 MG/3ML) 0.083% nebulizer solution Take 2.5 mg by nebulization every 6 (six) hours as needed for wheezing.      . Ascorbic Acid (VITAMIN C) 1000 MG tablet Take 1,000 mg by mouth daily.      . citalopram (CELEXA) 20 MG tablet Take 20 mg by mouth daily.      . fish oil-omega-3 fatty acids 1000 MG capsule Take 2 g by mouth daily.      . metoprolol (LOPRESSOR) 50 MG tablet Take 50 mg by mouth 2 (two) times daily.      . ramipril (ALTACE) 10 MG capsule Take 10 mg by mouth 2 (two) times daily.      . ranitidine (ZANTAC) 150 MG tablet Take 150 mg by mouth at bedtime.       . rosuvastatin (CRESTOR) 10 MG tablet Take 10 mg by mouth daily.      . verapamil (COVERA HS) 240 MG (CO) 24 hr tablet Take 240 mg by mouth 2 (two) times daily.       Marland Kitchen zolpidem (AMBIEN) 5 MG tablet Take 5 mg by mouth at bedtime as needed for sleep.        Objective: BP 99/65  Pulse 77  Temp(Src) 97.4 F (36.3 C) (Oral)  Resp 22  SpO2 92% Exam: General: Pleasant female, appears older than stated age, sitting up in bed, NAD. HEENT: Dry mucous membranes. NCAT.  Cardiovascular: RRR. No murmur.  Respiratory: Normal WOB.  Clear to auscultation. Abdomen: soft, nondistended.  Tender to palpation in the left lower quadrant. No rebound or guarding. Positive bowel sounds. Extremities: Warm, well perfused. No LE edema. Skin: Warm, dry, intact. Neuro: Alert and oriented x3.  No focal deficits on exam.    Labs and Imaging: CBC BMET   Recent Labs Lab 03/21/13 1715  HGB 16.7*  HCT 49.0*    Recent Labs Lab 03/21/13 1715  NA 142  K 4.1  CL 104  BUN 18  CREATININE 1.00  GLUCOSE 110*     Lactic Acid - 3.41  Urinalysis    Component Value Date/Time   COLORURINE YELLOW 03/21/2013 1847   APPEARANCEUR CLEAR 03/21/2013 1847   LABSPEC 1.019 03/21/2013 1847   PHURINE 5.0 03/21/2013 1847   GLUCOSEU NEGATIVE 03/21/2013 1847   HGBUR NEGATIVE 03/21/2013 1847   BILIRUBINUR NEGATIVE 03/21/2013 1847  KETONESUR NEGATIVE 03/21/2013 1847   PROTEINUR NEGATIVE 03/21/2013 1847   UROBILINOGEN 1.0 03/21/2013 1847   NITRITE NEGATIVE 03/21/2013 1847   LEUKOCYTESUR NEGATIVE 03/21/2013 1847   Ct Abdomen Pelvis W Contrast 03/21/2013   IMPRESSION: Stable appearance of colitis involving the splenic flexure and descending colon. This distribution is consistent with ischemic colitis. No other complication identified.  Stable diffuse hepatic steatosis.  Dg Chest Portable 1 View 03/21/2013   IMPRESSION: No active disease.   Tommie SamsJayce G Tushar Enns, DO 03/21/2013, 8:55 PM PGY-2, [facility] FPTS Intern pager: [phone], text pages welcome

## 2013-03-22 LAB — CBC WITH DIFFERENTIAL/PLATELET
BASOS ABS: 0 10*3/uL (ref 0.0–0.1)
Basophils Relative: 0 % (ref 0–1)
Eosinophils Absolute: 0.1 10*3/uL (ref 0.0–0.7)
Eosinophils Relative: 0 % (ref 0–5)
HCT: 43.1 % (ref 36.0–46.0)
HEMOGLOBIN: 14.3 g/dL (ref 12.0–15.0)
Lymphocytes Relative: 11 % — ABNORMAL LOW (ref 12–46)
Lymphs Abs: 1.7 10*3/uL (ref 0.7–4.0)
MCH: 31.4 pg (ref 26.0–34.0)
MCHC: 33.2 g/dL (ref 30.0–36.0)
MCV: 94.5 fL (ref 78.0–100.0)
MONOS PCT: 7 % (ref 3–12)
Monocytes Absolute: 1 10*3/uL (ref 0.1–1.0)
NEUTROS ABS: 12.2 10*3/uL — AB (ref 1.7–7.7)
NEUTROS PCT: 82 % — AB (ref 43–77)
Platelets: 243 10*3/uL (ref 150–400)
RBC: 4.56 MIL/uL (ref 3.87–5.11)
RDW: 13 % (ref 11.5–15.5)
WBC: 15 10*3/uL — ABNORMAL HIGH (ref 4.0–10.5)

## 2013-03-22 LAB — COMPREHENSIVE METABOLIC PANEL
ALBUMIN: 3.4 g/dL — AB (ref 3.5–5.2)
ALT: 162 U/L — ABNORMAL HIGH (ref 0–35)
AST: 83 U/L — ABNORMAL HIGH (ref 0–37)
Alkaline Phosphatase: 110 U/L (ref 39–117)
BILIRUBIN TOTAL: 0.5 mg/dL (ref 0.3–1.2)
BUN: 12 mg/dL (ref 6–23)
CO2: 25 mEq/L (ref 19–32)
CREATININE: 0.71 mg/dL (ref 0.50–1.10)
Calcium: 8.6 mg/dL (ref 8.4–10.5)
Chloride: 103 mEq/L (ref 96–112)
GFR calc Af Amer: >90 mL/min (ref >90)
GFR calc non Af Amer: >90 mL/min (ref >90)
Glucose, Bld: 119 mg/dL — ABNORMAL HIGH (ref 70–99)
Potassium: 4.4 mEq/L (ref 3.7–5.3)
Sodium: 140 mEq/L (ref 137–147)
TOTAL PROTEIN: 6.3 g/dL (ref 6.0–8.3)

## 2013-03-22 LAB — TROPONIN I
Troponin I: <0.3 ng/mL (ref <0.30)
Troponin I: <0.3 ng/mL (ref <0.30)

## 2013-03-22 LAB — CBC
HEMATOCRIT: 42.4 % (ref 36.0–46.0)
HEMOGLOBIN: 14.2 g/dL (ref 12.0–15.0)
MCH: 31.8 pg (ref 26.0–34.0)
MCHC: 33.5 g/dL (ref 30.0–36.0)
MCV: 94.9 fL (ref 78.0–100.0)
PLATELETS: 240 10*3/uL (ref 150–400)
RBC: 4.47 MIL/uL (ref 3.87–5.11)
RDW: 13.1 % (ref 11.5–15.5)
WBC: 18.5 10*3/uL — AB (ref 4.0–10.5)

## 2013-03-22 LAB — CREATININE, SERUM
CREATININE: 0.69 mg/dL (ref 0.50–1.10)
GFR calc Af Amer: >90 mL/min (ref >90)
GFR calc non Af Amer: >90 mL/min (ref >90)

## 2013-03-22 LAB — LACTIC ACID, PLASMA: Lactic Acid, Venous: 1.8 mmol/L (ref 0.5–2.2)

## 2013-03-22 MED ORDER — HYDROMORPHONE HCL PF 1 MG/ML IJ SOLN: 1.0000 mg | INTRAMUSCULAR | Status: DC | PRN

## 2013-03-22 MED ORDER — HYDROMORPHONE HCL PF 1 MG/ML IJ SOLN
1.0000 mg | INTRAMUSCULAR | Status: DC | PRN
  Administered 2013-03-22 – 2013-03-23 (×9): 1 mg via INTRAVENOUS
  Filled 2013-03-22 (×9): qty 1

## 2013-03-22 MED ORDER — SODIUM CHLORIDE 0.45 % IV SOLN
INTRAVENOUS | Status: DC
Start: 2013-03-22 — End: 2013-03-23
  Administered 2013-03-22: 12:00:00 via INTRAVENOUS

## 2013-03-22 NOTE — ED Provider Notes (Signed)
I have personally seen and examined the patient.  I have discussed the plan of care with the resident.  I have reviewed the documentation on PMH/FH/Soc. History.  I have reviewed the documentation of the resident and agree.  Doug SouSam Hannah Strader, MD 03/22/13 [phone]

## 2013-03-22 NOTE — Progress Notes (Signed)
Attending Addendum  I examined the patient and discussed the assessment and plan with Dr. Cook. I have reviewed the note and agree.    Please see addendum to H&P for today's A&P.   Hansini Clodfelter, MD FAMILY MEDICINE TEACHING SERVICE    

## 2013-03-22 NOTE — H&P (Signed)
Attending Addendum  I examined the patient and discussed the assessment and plan with Dr. Adriana Simasook. I have reviewed the note and agree.  Briefly, 58 yo F with history of ischemic colitis admitted for recurrent bout of ischemic colitis and rectal bleeding. She is usually able to control her symptoms at home and bleeding may not occur or last for 2 days. [facility].   Today she reports poor pain control with morphine. She is still having rectal bleeding, nausea and LLQ. No dizziness or lightheadedness or vomiting.   BP 135/77  Pulse 95  Temp(Src) 97.8 F (36.6 C) (Oral)  Resp 18  Ht 5\' 4"  (1.626 m)  Wt 195 lb 1.7 oz (88.5 kg)  BMI 33.47 kg/m2  SpO2 99% General appearance: alert, cooperative and no distress Lungs: clear to auscultation bilaterally Heart: regular rate and rhythm, S1, S2 normal, no murmur, click, rub or gallop Abdomen: mildly distended, decreased BS, TTP LLQ w guarding. no rebound. no mass.   A/P: 1. Ischemic colitis: Agree with cipro and flagyl given elevated WBC. Patient HD stable continue IVFs Trend WBC Changed pain med to dilaudid 1 mg q 3 with goal of improved pain control. NPO except sips with meds and few chips. NGT and surg consult  prn ileus Agree with GI consult.  Watchful waiting with serial abdominal exams. dispo pending clinical improvement.     Dessa PhiFUNCHES,Lillyauna Jenkinson, MD FAMILY MEDICINE TEACHING SERVICE

## 2013-03-22 NOTE — Progress Notes (Signed)
Family Medicine Teaching Service Daily Progress Note Intern Pager: [phone]  Patient name: Jamie BookbinderKaren Lynn Watkins Medical record number: [id number] Date of birth: 03/05/1955 Age: 58 y.o. Gender: female  Primary Care Provider: Ailene RavelHAMRICK,MAURA L, MD Consultants: None Code Status: Full code  Pt Overview and Major Events to Date:  2/21 - Admitted 2/22 - Severe pain overnight.  Changed Morphine to Dilaudid  Assessment and Plan: [name] is a 58 y.o. female presenting with presyncope secondary to exacerbation of colonic ischemia. PMH is significant for COPD/emphysema, hypertension, tobacco abuse, hyperlipidemia, GERD, and ischemic colitis.   Presyncope, Colonic ischemia - presyncope likely secondary to increased vagal tone in the setting of severe pain from acute exacerbation of colonic ischemia. Increased vagal tone also likely from straining to have a bowel movement.  - Starting clear liquid diet today - IV fluids change to half-normal saline (50 mL/hr) - Continuing IV Cipro/Flagyl to cover for potential intra-abdominal infection in setting of colonic ischemia  - Dilaudid 1 mg Q3H PRN - Will monitor closely via serial abdominal exams.  COPD/Emphysema  - No evidence of exacerbation currently.  - PRN Albuterol   HTN  - BP 135/77 this am. - Continuing to hold antihypertensives, especially in the setting of colonic ischemia to ensure perfusion of the gut.  - If rises today, will restart back some of her home antihypersives  Hyperlipidemia  - Starting statin today  GERD  - Protonix   Tobacco abuse  - Cessation counseling during admission.   Psych  - Restarting Celexa today  FEN/GI: Clear liquid diet PPx: Heparin SQ  Disposition:  Pending clinical improvement.  Subjective:  Severe pain overnight.   Patient reports passage of blood overnight/this am. No other complaints.   Objective: Temp:  [97.4 F (36.3 C)-98.1 F (36.7 C)] 97.8 F (36.6 C) (02/22 0447) Pulse  Rate:  [73-95] 95 (02/22 0447) Resp:  [13-22] 18 (02/22 0447) BP: (88-135)/(53-100) 135/77 mmHg (02/22 0447) SpO2:  [86 %-99 %] 99 % (02/22 0447) Weight:  [195 lb 1.7 oz (88.5 kg)-196 lb 10.4 oz (89.2 kg)] 195 lb 1.7 oz (88.5 kg) (02/22 0447)  Physical Exam: General: resting in bed, NAD. Cardiovascular: RRR. No murmur. Respiratory: CTAB.  Abdomen: soft, nondistended. Tender to palpation in the left lower quadrant. Positive bowel sounds. Extremities: No LE edema.   Laboratory:  Recent Labs Lab 03/21/13 1715 03/22/13 0040 03/22/13 0635  WBC  --  15.0* 18.5*  HGB 16.7* 14.3 14.2  HCT 49.0* 43.1 42.4  PLT  --  243 240    Recent Labs Lab 03/21/13 1654 03/21/13 1715 03/22/13 0040 03/22/13 0635  NA  --  142  --  140  K  --  4.1  --  4.4  CL  --  104  --  103  CO2  --   --   --  25  BUN  --  18  --  12  CREATININE  --  1.00 0.69 0.71  CALCIUM  --   --   --  8.6  PROT 6.8  --   --  6.3  BILITOT 0.4  --   --  0.5  ALKPHOS 101  --   --  110  ALT 71*  --   --  162*  AST 44*  --   --  83*  GLUCOSE  --  110*  --  119*   Urinalysis    Component Value Date/Time   COLORURINE YELLOW 03/21/2013 1847   APPEARANCEUR CLEAR 03/21/2013  1847   LABSPEC 1.019 03/21/2013 1847   PHURINE 5.0 03/21/2013 1847   GLUCOSEU NEGATIVE 03/21/2013 1847   HGBUR NEGATIVE 03/21/2013 1847   BILIRUBINUR NEGATIVE 03/21/2013 1847   KETONESUR NEGATIVE 03/21/2013 1847   PROTEINUR NEGATIVE 03/21/2013 1847   UROBILINOGEN 1.0 03/21/2013 1847   NITRITE NEGATIVE 03/21/2013 1847   LEUKOCYTESUR NEGATIVE 03/21/2013 1847   Lactic Acid - 3.41  Imaging/Diagnostic Tests:  Ct Abdomen Pelvis W Contrast  03/21/2013 IMPRESSION: Stable appearance of colitis involving the splenic flexure and descending colon. This distribution is consistent with ischemic colitis. No other complication identified. Stable diffuse hepatic steatosis.   Dg Chest Portable 1 View  03/21/2013 IMPRESSION: No active disease.   Tommie Sams,  DO 03/22/2013, 9:36 AM PGY-2, [facility] FPTS Intern pager: [phone], text pages welcome

## 2013-03-22 NOTE — Progress Notes (Signed)
Utilization Review Completed.   Avien Taha, RN, BSN Nurse Case Manager  

## 2013-03-23 MED ORDER — CIPROFLOXACIN HCL 500 MG PO TABS: 500.0000 mg | ORAL_TABLET | Freq: Two times a day (BID) | ORAL | Status: DC

## 2013-03-23 MED ORDER — METRONIDAZOLE 500 MG PO TABS
500.0000 mg | ORAL_TABLET | Freq: Three times a day (TID) | ORAL | Status: DC
  Administered 2013-03-23: 500 mg via ORAL
  Filled 2013-03-23 (×3): qty 1

## 2013-03-23 MED ORDER — CIPROFLOXACIN HCL 500 MG PO TABS
500.0000 mg | ORAL_TABLET | Freq: Two times a day (BID) | ORAL | Status: DC
  Filled 2013-03-23: qty 1

## 2013-03-23 MED ORDER — OXYCODONE-ACETAMINOPHEN 5-325 MG PO TABS: 1.0000 | ORAL_TABLET | ORAL | Status: DC | PRN

## 2013-03-23 MED ORDER — METRONIDAZOLE 500 MG PO TABS
500.0000 mg | ORAL_TABLET | Freq: Three times a day (TID) | ORAL | Status: DC
Start: 2013-03-23 — End: 2016-12-18

## 2013-03-23 MED ORDER — OXYCODONE-ACETAMINOPHEN 5-325 MG PO TABS
1.0000 | ORAL_TABLET | ORAL | Status: DC | PRN
  Administered 2013-03-23: 2 via ORAL
  Filled 2013-03-23: qty 2

## 2013-03-23 NOTE — Progress Notes (Signed)
IV and tele monitor d/c at this time; pt given d/c instructions and prescriptions; pt verbalized understanding.

## 2013-03-23 NOTE — Discharge Instructions (Addendum)
Please follow up closely with GI. Colitis Colitis is inflammation of the colon. Colitis can be a short-term or long-standing (chronic) illness. Crohn's disease and ulcerative colitis are 2 types of colitis which are chronic. They usually require lifelong treatment. CAUSES  There are many different causes of colitis, including:  Viruses.  Germs (bacteria).  Medicine reactions. SYMPTOMS   Diarrhea.  Intestinal bleeding.  Pain.  Fever.  Throwing up (vomiting).  Tiredness (fatigue).  Weight loss.  Bowel blockage. DIAGNOSIS  The diagnosis of colitis is based on examination and stool or blood tests. X-rays, CT scan, and colonoscopy may also be needed. TREATMENT  Treatment may include:  Fluids given through the vein (intravenously).  Bowel rest (nothing to eat or drink for a period of time).  Medicine for pain and diarrhea.  Medicines (antibiotics) that kill germs.  Cortisone medicines.  Surgery. HOME CARE INSTRUCTIONS   Get plenty of rest.  Drink enough water and fluids to keep your urine clear or pale yellow.  Eat a well-balanced diet.  Call your caregiver for follow-up as recommended. SEEK IMMEDIATE MEDICAL CARE IF:   You develop chills.  You have an oral temperature above 102 F (38.9 C), not controlled by medicine.  You have extreme weakness, fainting, or dehydration.  You have repeated vomiting.  You develop severe belly (abdominal) pain or are passing bloody or tarry stools. MAKE SURE YOU:   Understand these instructions.  Will watch your condition.  Will get help right away if you are not doing well or get worse. Document Released: 02/23/2004 Document Revised: 04/09/2011 Document Reviewed: 05/20/2009 [facility] Patient Information 2014 [employer] [place].

## 2013-03-23 NOTE — Progress Notes (Signed)
Family Medicine Teaching Service Daily Progress Note Intern Pager: [phone]  Patient name: Jamie BookbinderKaren Lynn Nethery Medical record number: [id number] Date of birth: 1955/09/30 Age: 58 y.o. Gender: female  Primary Care Provider: Ailene RavelHAMRICK,MAURA L, MD Consultants: None Code Status: Full code  Pt Overview and Major Events to Date:  2/21 - Admitted 2/22 - Severe pain overnight.  Changed Morphine to Dilaudid 2/23 - Resolved GI bleed, improved abd pain (LLQ), transition Dilaudid IV --> Percocet PO, tolerating diet  Assessment and Plan: [name] is a 58 y.o. female presenting with presyncope secondary to exacerbation of colonic ischemia. PMH is significant for COPD/emphysema, hypertension, tobacco abuse, hyperlipidemia, GERD, and ischemic colitis.   Presyncope, Colonic ischemia - presyncope likely secondary to increased vagal tone in the setting of severe pain from acute exacerbation of colonic ischemia. Increased vagal tone also likely from straining to have a bowel movement.  - significant clinical improvement today, resolved GI bleed, improved abd pain (LLQ) worse with BM, advanced to full liquid diet - IV fluids change to half-normal saline (50 mL/hr) - transition to Cipro/Flagyl PO x 7 day total course (last dose 2/27), to cover for potential intra-abdominal infection in setting of colonic ischemia - transition to PO with Percocet 5-325mg  1-2 tabs q 3 hr PRN  - DC'd Dilaudid 1 mg Q3H PRN (received x 4 doses today) - Discussed importance of close GI / Surgery follow-up. Plans to schedule apt this week with current GI (Dr. Jennye [place]Misenheimer[place], also plans to be eval by Gen Surgery (Dr. Logan BoresEvans[place] in [place], prior discussions about possible elective bowel resection.  COPD/Emphysema  - No evidence of exacerbation currently.  - PRN Albuterol   HTN  - BP 135/77 this am. - Continuing to hold antihypertensives, especially in the setting of colonic ischemia to ensure perfusion of the gut.   - If rises today, will restart back some of her home antihypersives  Hyperlipidemia  - continue statin  GERD  - Protonix   Tobacco abuse  - Cessation counseling during admission.   Psych  - continue Celexa  FEN/GI: Full liquid diet PPx: Heparin SQ  Disposition:  Significant clinical improvement, expect to discharge to home today with close follow-up (including GI), if tolerating advanced diet and PO pain meds  Subjective: No acute events overnight. Reports feeling better today, states bleeding significantly slowed down, abdominal pain improved (5/10) but acutely worsens with BM. Ready to try full liquid diet with soups. Reports recent h/o 4-5 similar episodes in past, but believes that this one is worst. Eager to go home, would like to try Percocet PO (normally on Norco at home for pain), but takes Percocet for flares.  Denies fever/chills, HA, CP, nausea.  Objective: Temp:  [97.5 F (36.4 C)-98.4 F (36.9 C)] 97.5 F (36.4 C) (02/23 0548) Pulse Rate:  [81-97] 81 (02/23 0548) Resp:  [18] 18 (02/23 0548) BP: (114-127)/(78-79) 127/78 mmHg (02/23 0548) SpO2:  [93 %-96 %] 93 % (02/23 0548) Weight:  [195 lb 3.2 oz (88.542 kg)] 195 lb 3.2 oz (88.542 kg) (02/23 0548)  Physical Exam: General: resting in bed, NAD. Cardiovascular: RRR. No murmur. Respiratory: CTAB.  Abdomen: soft, nondistended. Tender to palpation in the left lower quadrant. Positive bowel sounds. Extremities: No LE edema.   Laboratory:  Recent Labs Lab 03/21/13 1715 03/22/13 0040 03/22/13 0635  WBC  --  15.0* 18.5*  HGB 16.7* 14.3 14.2  HCT 49.0* 43.1 42.4  PLT  --  243 240    Recent Labs Lab  03/21/13 1654 03/21/13 1715 03/22/13 0040 03/22/13 0635  NA  --  142  --  140  K  --  4.1  --  4.4  CL  --  104  --  103  CO2  --   --   --  25  BUN  --  18  --  12  CREATININE  --  1.00 0.69 0.71  CALCIUM  --   --   --  8.6  PROT 6.8  --   --  6.3  BILITOT 0.4  --   --  0.5  ALKPHOS 101  --   --   110  ALT 71*  --   --  162*  AST 44*  --   --  83*  GLUCOSE  --  110*  --  119*   Urinalysis    Component Value Date/Time   COLORURINE YELLOW 03/21/2013 1847   APPEARANCEUR CLEAR 03/21/2013 1847   LABSPEC 1.019 03/21/2013 1847   PHURINE 5.0 03/21/2013 1847   GLUCOSEU NEGATIVE 03/21/2013 1847   HGBUR NEGATIVE 03/21/2013 1847   BILIRUBINUR NEGATIVE 03/21/2013 1847   KETONESUR NEGATIVE 03/21/2013 1847   PROTEINUR NEGATIVE 03/21/2013 1847   UROBILINOGEN 1.0 03/21/2013 1847   NITRITE NEGATIVE 03/21/2013 1847   LEUKOCYTESUR NEGATIVE 03/21/2013 1847   Lactic Acid - 3.41  Imaging/Diagnostic Tests:  Ct Abdomen Pelvis W Contrast  03/21/2013 IMPRESSION: Stable appearance of colitis involving the splenic flexure and descending colon. This distribution is consistent with ischemic colitis. No other complication identified. Stable diffuse hepatic steatosis.   Dg Chest Portable 1 View  03/21/2013 IMPRESSION: No active disease.   Saralyn Pilar, DO 03/23/2013, 11:58 AM PGY-1, [facility] FPTS Intern pager: [phone], text pages welcome

## 2013-03-23 NOTE — Progress Notes (Signed)
Pt awaiting husband to arrive for transportation home; pt dressing at this time; will cont. To monitor.

## 2013-03-23 NOTE — Progress Notes (Signed)
FMTS ATTENDING  NOTE Jamie Fredin,MD I  have seen and examined this patient, reviewed their chart. I have discussed this patient with the resident. I agree with the resident's findings, assessment and care plan.  Patient still having abdominal pain when seen this morning, she however stated this has improved,she denies any more blood in her stool. Patient asking to go home today due to insurance problem. I recommended at least 24 hr monitoring and consider inpatient GI consult and evaluation but patient does not want this. For now we would monitor for an improvement,if there is an improvement may consider d/c home with GI follow up this week.Advance diet as tolerated.

## 2013-03-24 NOTE — Discharge Summary (Signed)
Family Medicine Teaching Service [facility]  Patient name: [name] Medical record number: [id number] Date of birth: [birth year] Age: 58 y.o. Gender: female Date of Admission: 03/21/2013  Date of Discharge: 03/23/13 Admitting Physician: Lora PaulaJosalyn C Funches, MD  Primary Care Provider: Ailene RavelHAMRICK,MAURA L, MD Consultants: none  Indication for Hospitalization: Abdominal Pain, Pre-syncope  Discharge Diagnoses/Problem List:  Acute on chronic ischemic colitis - Improved Pre-syncope, secondary to ischemic colitis - Resolved COPD HTN HLD GERD Tobacco abuse Psych / Depression  Disposition: Home  Discharge Condition: Stable  Discharge Exam: General: resting in bed, NAD.  Cardiovascular: RRR. No murmur.  Respiratory: CTAB.  Abdomen: soft, nondistended. Tender to palpation in the left lower quadrant. Positive bowel sounds.  Extremities: No LE edema.   Brief Hospital Course: Jamie BookbinderKaren Lynn Watkins is a 58 y.o. female presenting with presyncope secondary to exacerbation of colonic ischemia. PMH is significant for COPD/emphysema, hypertension, tobacco abuse, hyperlipidemia, GERD, and ischemic colitis.   # Acute on chronic ischemic colitis - Improved # Pre-syncope, secondary to ischemic colitis - Resolved H/o chronic (>5 yrs) ischemic colitis, with multiple prior flares in past. Presented to ED after sudden onset LLQ pain, similar to prior flares, followed by attempted BM with subsequent pre-syncopal vagal symptoms, with no LOC, did have hematochezia x 1 in ED. Initial work-up with elevated lactic acid 3.41, CT Abd/pelvis (stable colitis splenic flexure / descending colon, consistent w/ ischemic colitis), treated with IVF rehydration, Morphine IV, empiric Cipro/Flagyl IV, transitioned to PO after 48 hrs. During course, rectal bleeding resolved (stable Hgb 14.2) and LLQ abd pain significantly improved, able to tolerate full liquid diet, Percocet PO for pain control, Cipro/Flagyl  PO. Recommended continued hospitalization, but patient agreeable to discharge with PO pain control after resolution of bleeding. Prior to discharge remained hemodynamically stable, agreeable to close follow-up with PCP and GI.   # COPD - Stable No evidence of exacerbation during hospitalization. Continued Albuterol PRN.   # HTN - Stable BP remained stable 130s/70s. Held anti-HTN meds given GI bleed and pre-syncope prior to presentation. Additionally, important for patient to maintain appropriate BP for GI perfusion. Resume BP meds on discharge.  # HLD Continued statin.  # GERD  Received Protonix.  # Tobacco abuse  Cessation counseling during admission.  # Psych / Depression Continued Celexa  Issues for Follow Up: 1. Ischemic Colitis symptom resolution - Follow-up pain control on Percocet PO, consider re-check Hgb if bleeding continues. Hgb on discharge was 14.2 2. Complete empiric abx - Cipro 500mg  BID / Flagyl 500 TID x total 7 day course (last dose (03/27/13) 3. GI / Surgery follow-up - To schedule apt within 1 week with [facility] (Dr. Jennye BoroughsMisenheimer), plan to arrange outpatient follow-up with General Surgery in [place] to discuss possible elective bowel resection.  Significant Procedures: none  Significant Labs and Imaging:   Recent Labs Lab 03/21/13 1715 03/22/13 0040 03/22/13 0635  WBC  --  15.0* 18.5*  HGB 16.7* 14.3 14.2  HCT 49.0* 43.1 42.4  PLT  --  243 240    Recent Labs Lab 03/21/13 1654 03/21/13 1715 03/22/13 0040 03/22/13 0635  NA  --  142  --  140  K  --  4.1  --  4.4  CL  --  104  --  103  CO2  --   --   --  25  GLUCOSE  --  110*  --  119*  BUN  --  18  --  12  CREATININE  --  1.00 0.69 0.71  CALCIUM  --   --   --  8.6  ALKPHOS 101  --   --  110  AST 44*  --   --  83*  ALT 71*  --   --  162*  ALBUMIN 3.7  --   --  3.4*   Urinalysis    Component Value Date/Time   COLORURINE YELLOW 03/21/2013 1847   APPEARANCEUR CLEAR 03/21/2013 1847   LABSPEC  1.019 03/21/2013 1847   PHURINE 5.0 03/21/2013 1847   GLUCOSEU NEGATIVE 03/21/2013 1847   HGBUR NEGATIVE 03/21/2013 1847   BILIRUBINUR NEGATIVE 03/21/2013 1847   KETONESUR NEGATIVE 03/21/2013 1847   PROTEINUR NEGATIVE 03/21/2013 1847   UROBILINOGEN 1.0 03/21/2013 1847   NITRITE NEGATIVE 03/21/2013 1847   LEUKOCYTESUR NEGATIVE 03/21/2013 1847   Lactic Acid - 3.41  Imaging/Diagnostic Tests:  Ct Abdomen Pelvis W Contrast  03/21/2013 IMPRESSION: Stable appearance of colitis involving the splenic flexure and descending colon. This distribution is consistent with ischemic colitis. No other complication identified. Stable diffuse hepatic steatosis.  Dg Chest Portable 1 View  03/21/2013 IMPRESSION: No active disease.   Results/Tests Pending at Time of Discharge: none  Discharge Medications:    Medication List    STOP taking these medications       HYDROcodone-acetaminophen 5-325 MG per tablet  Commonly known as:  NORCO/VICODIN     nitroGLYCERIN 0.4 MG SL tablet  Commonly known as:  NITROSTAT      TAKE these medications       albuterol (2.5 MG/3ML) 0.083% nebulizer solution  Commonly known as:  PROVENTIL  Take 2.5 mg by nebulization every 6 (six) hours as needed for wheezing.     CALCIUM 600 + D 600-200 MG-UNIT Tabs  Generic drug:  Calcium Carb-Cholecalciferol  Take 1 tablet by mouth daily.     ciprofloxacin 500 MG tablet  Commonly known as:  CIPRO  Take 1 tablet (500 mg total) by mouth 2 (two) times daily.     citalopram 20 MG tablet  Commonly known as:  CELEXA  Take 20 mg by mouth daily.     esomeprazole 40 MG capsule  Commonly known as:  NEXIUM  Take 40 mg by mouth daily at 12 noon.     fish oil-omega-3 fatty acids 1000 MG capsule  Take 2 g by mouth daily.     metoprolol 50 MG tablet  Commonly known as:  LOPRESSOR  Take 50 mg by mouth 2 (two) times daily.     metroNIDAZOLE 500 MG tablet  Commonly known as:  FLAGYL  Take 1 tablet (500 mg total) by mouth 3 (three) times  daily.     oxyCODONE-acetaminophen 5-325 MG per tablet  Commonly known as:  PERCOCET/ROXICET  Take 1-2 tablets by mouth every 3 (three) hours as needed for moderate pain or severe pain.     ramipril 10 MG capsule  Commonly known as:  ALTACE  Take 10 mg by mouth 2 (two) times daily.     ranitidine 150 MG tablet  Commonly known as:  ZANTAC  Take 150 mg by mouth at bedtime.     rosuvastatin 10 MG tablet  Commonly known as:  CRESTOR  Take 10 mg by mouth daily.     verapamil 240 MG (CO) 24 hr tablet  Commonly known as:  COVERA HS  Take 240 mg by mouth 2 (two) times daily.     vitamin C 1000 MG tablet  Take 1,000 mg by mouth daily.  zolpidem 5 MG tablet  Commonly known as:  AMBIEN  Take 5 mg by mouth at bedtime as needed for sleep.        Discharge Instructions: Please refer to Patient Instructions section of EMR for full details.  Patient was counseled important signs and symptoms that should prompt return to medical care, changes in medications, dietary instructions, activity restrictions, and follow up appointments.   Follow-Up Appointments: Follow-up Information   Follow up with [facility], MD In 1 week.   Specialty:  Family Medicine   Contact information:   Dr. Burnell Blanks [address] [place] [address] [phone]       Follow up with Laurell Roof, MD. Schedule an appointment as soon as possible for a visit in 1 week.   Specialty:  Unknown Physician Specialty   Contact information:   [address] [place] [address] [phone]       Saralyn Pilar, DO 03/25/2013, 11:52 PM PGY-1, [facility]

## 2013-03-26 NOTE — Discharge Summary (Signed)
FMTS ATTENDING  NOTE Pavel Gadd,MD I  have seen and examined this patient, reviewed their chart. I have discussed this patient with the resident. I agree with the resident's findings, assessment and care plan. 

## 2013-11-13 ENCOUNTER — Other Ambulatory Visit: Payer: Self-pay

## 2016-11-29 ENCOUNTER — Other Ambulatory Visit: Payer: Self-pay | Admitting: Neurosurgery

## 2016-12-13 ENCOUNTER — Encounter (HOSPITAL_COMMUNITY): Payer: Self-pay | Admitting: *Deleted

## 2016-12-14 ENCOUNTER — Other Ambulatory Visit: Payer: Self-pay

## 2016-12-14 ENCOUNTER — Encounter (HOSPITAL_COMMUNITY): Payer: Self-pay | Admitting: *Deleted

## 2016-12-17 ENCOUNTER — Inpatient Hospital Stay (HOSPITAL_COMMUNITY): Payer: Commercial Managed Care - PPO

## 2016-12-17 ENCOUNTER — Inpatient Hospital Stay (HOSPITAL_COMMUNITY)
Admission: RE | Admit: 2016-12-17 | Discharge: 2016-12-18 | DRG: 455 | Disposition: A | Discharge status: Home / Self Care | Payer: Commercial Managed Care - PPO | Source: Ambulatory Visit | Attending: Neurosurgery | Admitting: Neurosurgery

## 2016-12-17 ENCOUNTER — Encounter (HOSPITAL_COMMUNITY): Payer: Self-pay | Admitting: Surgery

## 2016-12-17 ENCOUNTER — Encounter (HOSPITAL_COMMUNITY)
Admission: RE | Disposition: A | Discharge status: Home / Self Care | Payer: Self-pay | Source: Ambulatory Visit | Attending: Neurosurgery

## 2016-12-17 ENCOUNTER — Other Ambulatory Visit: Payer: Self-pay

## 2016-12-17 DIAGNOSIS — K219 Gastro-esophageal reflux disease without esophagitis: Secondary | ICD-10-CM | POA: Diagnosis present

## 2016-12-17 DIAGNOSIS — Z792 Long term (current) use of antibiotics: Secondary | ICD-10-CM | POA: Diagnosis not present

## 2016-12-17 DIAGNOSIS — Z7951 Long term (current) use of inhaled steroids: Secondary | ICD-10-CM

## 2016-12-17 DIAGNOSIS — Z833 Family history of diabetes mellitus: Secondary | ICD-10-CM | POA: Diagnosis not present

## 2016-12-17 DIAGNOSIS — J439 Emphysema, unspecified: Secondary | ICD-10-CM | POA: Diagnosis present

## 2016-12-17 DIAGNOSIS — Z832 Family history of diseases of the blood and blood-forming organs and certain disorders involving the immune mechanism: Secondary | ICD-10-CM

## 2016-12-17 DIAGNOSIS — M5416 Radiculopathy, lumbar region: Secondary | ICD-10-CM | POA: Diagnosis present

## 2016-12-17 DIAGNOSIS — Z888 Allergy status to other drugs, medicaments and biological substances status: Secondary | ICD-10-CM

## 2016-12-17 DIAGNOSIS — Z9049 Acquired absence of other specified parts of digestive tract: Secondary | ICD-10-CM

## 2016-12-17 DIAGNOSIS — M488X6 Other specified spondylopathies, lumbar region: Secondary | ICD-10-CM | POA: Diagnosis present

## 2016-12-17 DIAGNOSIS — Z8249 Family history of ischemic heart disease and other diseases of the circulatory system: Secondary | ICD-10-CM | POA: Diagnosis not present

## 2016-12-17 DIAGNOSIS — M4316 Spondylolisthesis, lumbar region: Secondary | ICD-10-CM

## 2016-12-17 DIAGNOSIS — M48061 Spinal stenosis, lumbar region without neurogenic claudication: Secondary | ICD-10-CM | POA: Diagnosis present

## 2016-12-17 DIAGNOSIS — Z823 Family history of stroke: Secondary | ICD-10-CM | POA: Diagnosis not present

## 2016-12-17 DIAGNOSIS — Z885 Allergy status to narcotic agent status: Secondary | ICD-10-CM

## 2016-12-17 DIAGNOSIS — Z79891 Long term (current) use of opiate analgesic: Secondary | ICD-10-CM

## 2016-12-17 DIAGNOSIS — M810 Age-related osteoporosis without current pathological fracture: Secondary | ICD-10-CM | POA: Diagnosis present

## 2016-12-17 DIAGNOSIS — Z800 Family history of malignant neoplasm of digestive organs: Secondary | ICD-10-CM

## 2016-12-17 DIAGNOSIS — Z87891 Personal history of nicotine dependence: Secondary | ICD-10-CM | POA: Diagnosis not present

## 2016-12-17 DIAGNOSIS — Z90710 Acquired absence of both cervix and uterus: Secondary | ICD-10-CM

## 2016-12-17 DIAGNOSIS — I10 Essential (primary) hypertension: Secondary | ICD-10-CM | POA: Diagnosis present

## 2016-12-17 DIAGNOSIS — G4730 Sleep apnea, unspecified: Secondary | ICD-10-CM | POA: Diagnosis present

## 2016-12-17 DIAGNOSIS — Z419 Encounter for procedure for purposes other than remedying health state, unspecified: Secondary | ICD-10-CM

## 2016-12-17 DIAGNOSIS — Z79899 Other long term (current) drug therapy: Secondary | ICD-10-CM | POA: Diagnosis not present

## 2016-12-17 DIAGNOSIS — Z7982 Long term (current) use of aspirin: Secondary | ICD-10-CM

## 2016-12-17 HISTORY — DX: Gastrointestinal hemorrhage, unspecified: K92.2

## 2016-12-17 HISTORY — DX: Pneumonia, unspecified organism: J18.9

## 2016-12-17 HISTORY — DX: Nausea with vomiting, unspecified: Z98.890

## 2016-12-17 HISTORY — DX: Depression, unspecified: F32.A

## 2016-12-17 HISTORY — DX: Sleep apnea, unspecified: G47.30

## 2016-12-17 HISTORY — DX: Cardiac murmur, unspecified: R01.1

## 2016-12-17 HISTORY — DX: Spondylolisthesis, lumbar region: M43.16

## 2016-12-17 HISTORY — DX: Gastro-esophageal reflux disease without esophagitis: K21.9

## 2016-12-17 HISTORY — DX: Major depressive disorder, single episode, unspecified: F32.9

## 2016-12-17 HISTORY — DX: Nausea with vomiting, unspecified: R11.2

## 2016-12-17 LAB — CBC
HCT: 47.1 % — ABNORMAL HIGH (ref 36.0–46.0)
HEMOGLOBIN: 15.8 g/dL — AB (ref 12.0–15.0)
MCH: 31.7 pg (ref 26.0–34.0)
MCHC: 33.5 g/dL (ref 30.0–36.0)
MCV: 94.6 fL (ref 78.0–100.0)
PLATELETS: 333 10*3/uL (ref 150–400)
RBC: 4.98 MIL/uL (ref 3.87–5.11)
RDW: 13.4 % (ref 11.5–15.5)
WBC: 12.8 10*3/uL — ABNORMAL HIGH (ref 4.0–10.5)

## 2016-12-17 LAB — BASIC METABOLIC PANEL
Anion gap: 8 (ref 5–15)
BUN: 18 mg/dL (ref 6–20)
CALCIUM: 9.1 mg/dL (ref 8.9–10.3)
CO2: 22 mmol/L (ref 22–32)
CREATININE: 0.78 mg/dL (ref 0.44–1.00)
Chloride: 111 mmol/L (ref 101–111)
GFR calc Af Amer: >60 mL/min (ref >60)
GLUCOSE: 99 mg/dL (ref 65–99)
Potassium: 3.8 mmol/L (ref 3.5–5.1)
Sodium: 141 mmol/L (ref 135–145)

## 2016-12-17 LAB — ABO/RH: ABO/RH(D): AB POS

## 2016-12-17 LAB — TYPE AND SCREEN
ABO/RH(D): AB POS
ANTIBODY SCREEN: NEGATIVE

## 2016-12-17 IMAGING — CR DG LUMBAR SPINE 1V
1 series · 1 of 1 positions shown · non-contrast
Comparison: [DATE]

CLINICAL DATA: L4-5 fusion

EXAM:
LUMBAR SPINE - 1 VIEW

[lateral]
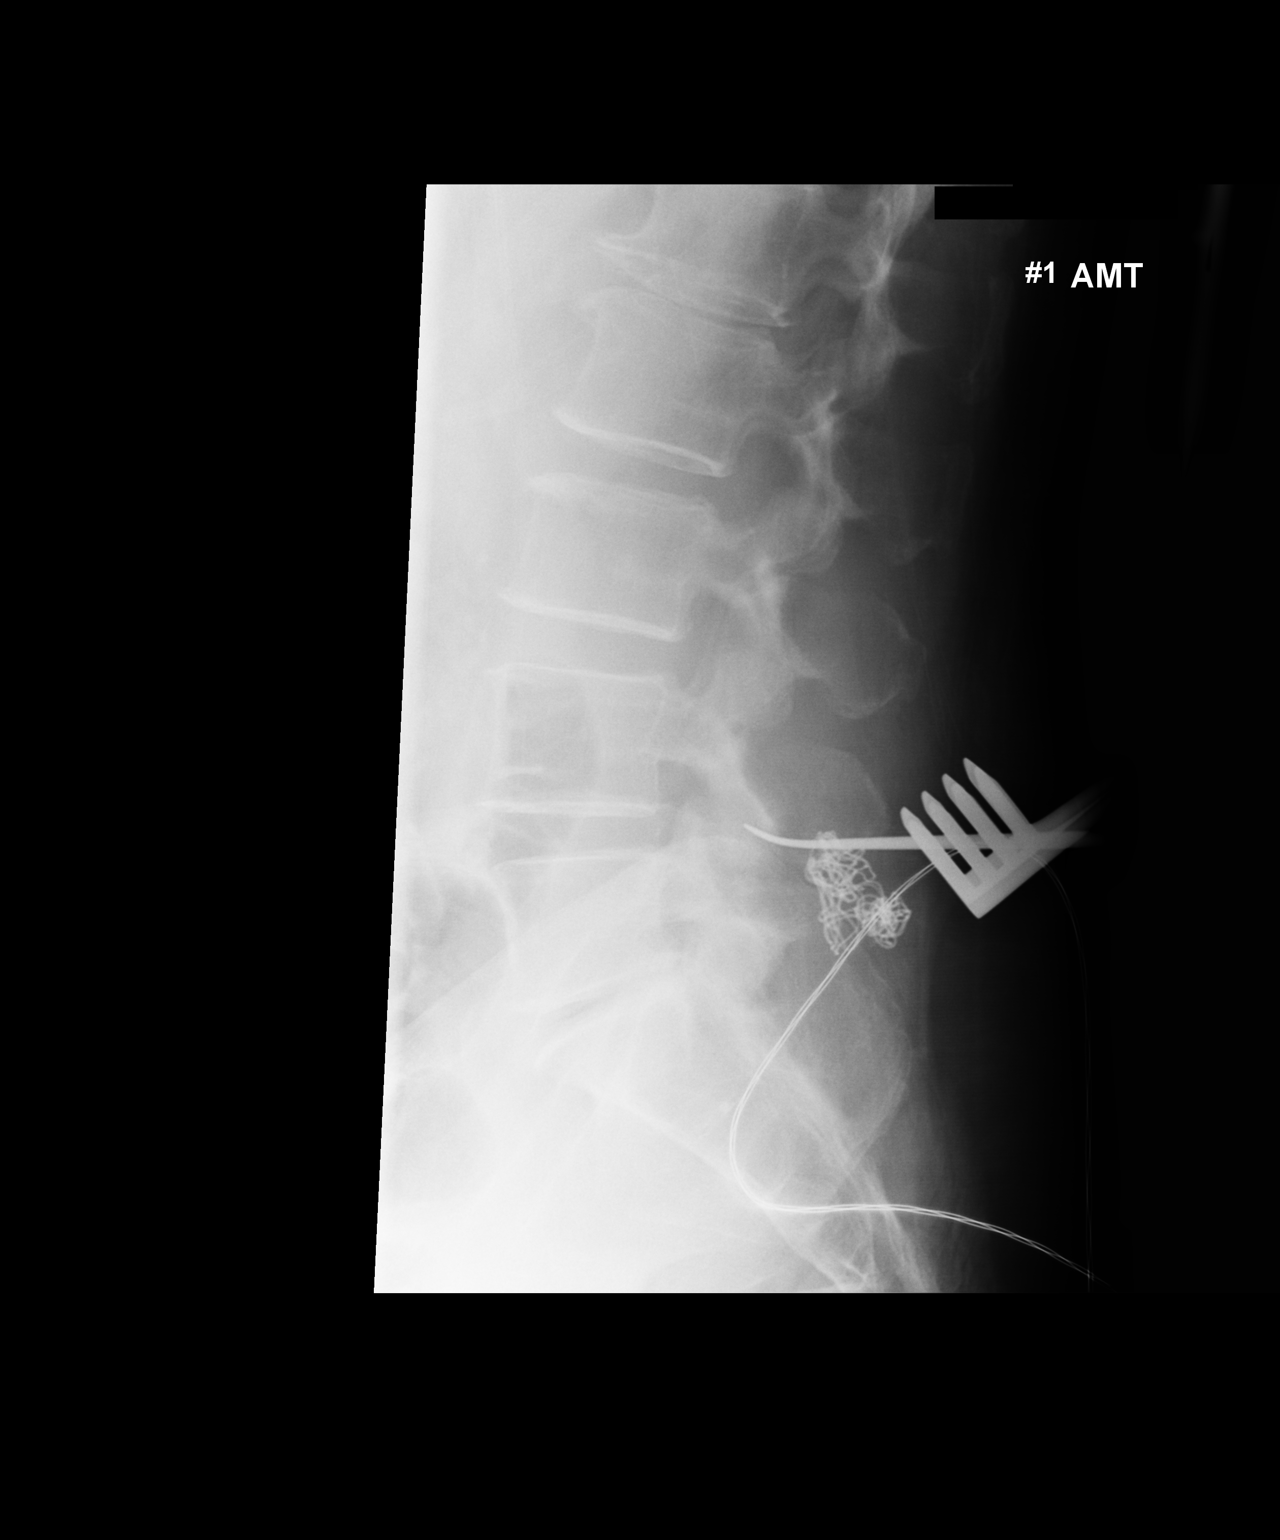

[1 of 1 positions shown; findings below may reference images not displayed]

FINDINGS: Posterior surgical instruments are directed at the L4-5 level.
IMPRESSION: Intraoperative localization of L4-5.

## 2016-12-17 SURGERY — POSTERIOR LUMBAR FUSION 1 LEVEL
Anesthesia: General

## 2016-12-17 MED ORDER — MORPHINE SULFATE (PF) 4 MG/ML IV SOLN
4.0000 mg | INTRAVENOUS | Status: DC | PRN
  Administered 2016-12-17: 4 mg via INTRAVENOUS
  Filled 2016-12-17: qty 1

## 2016-12-17 MED ORDER — HYDROMORPHONE HCL 1 MG/ML IJ SOLN
0.2500 mg | INTRAMUSCULAR | Status: DC | PRN
  Administered 2016-12-17 (×2): 0.5 mg via INTRAVENOUS

## 2016-12-17 MED ORDER — VANCOMYCIN HCL 1000 MG IV SOLR
INTRAVENOUS | Status: DC | PRN
  Administered 2016-12-17: 1000 mg

## 2016-12-17 MED ORDER — ONDANSETRON HCL 4 MG/2ML IJ SOLN: 4.0000 mg | Freq: Four times a day (QID) | INTRAMUSCULAR | Status: DC | PRN

## 2016-12-17 MED ORDER — ONDANSETRON HCL 4 MG/2ML IJ SOLN
INTRAMUSCULAR | Status: DC | PRN
  Administered 2016-12-17: 4 mg via INTRAVENOUS

## 2016-12-17 MED ORDER — CHLORHEXIDINE GLUCONATE CLOTH 2 % EX PADS: 6.0000 | MEDICATED_PAD | Freq: Once | CUTANEOUS | Status: DC

## 2016-12-17 MED ORDER — LACTATED RINGERS IV SOLN
INTRAVENOUS | Status: DC
  Administered 2016-12-17 (×3): via INTRAVENOUS

## 2016-12-17 MED ORDER — VERAPAMIL HCL ER 240 MG PO TBCR
240.0000 mg | EXTENDED_RELEASE_TABLET | Freq: Two times a day (BID) | ORAL | Status: DC
  Filled 2016-12-17 (×2): qty 1

## 2016-12-17 MED ORDER — HYDROMORPHONE HCL 1 MG/ML IJ SOLN
INTRAMUSCULAR | Status: AC
  Administered 2016-12-17: 0.5 mg via INTRAVENOUS
  Filled 2016-12-17: qty 1

## 2016-12-17 MED ORDER — FAMOTIDINE 20 MG PO TABS
20.0000 mg | ORAL_TABLET | Freq: Two times a day (BID) | ORAL | Status: DC
  Administered 2016-12-17 – 2016-12-18 (×2): 20 mg via ORAL
  Filled 2016-12-17 (×2): qty 1

## 2016-12-17 MED ORDER — SODIUM CHLORIDE 0.9% FLUSH: 3.0000 mL | INTRAVENOUS | Status: DC | PRN

## 2016-12-17 MED ORDER — BACITRACIN ZINC 500 UNIT/GM EX OINT
TOPICAL_OINTMENT | CUTANEOUS | Status: DC | PRN
  Administered 2016-12-17: 1 via TOPICAL

## 2016-12-17 MED ORDER — BACITRACIN ZINC 500 UNIT/GM EX OINT
TOPICAL_OINTMENT | CUTANEOUS | Status: AC
  Filled 2016-12-17: qty 28.35

## 2016-12-17 MED ORDER — CEFAZOLIN SODIUM-DEXTROSE 2-4 GM/100ML-% IV SOLN
2.0000 g | INTRAVENOUS | Status: AC
  Administered 2016-12-17: 2 g via INTRAVENOUS
  Filled 2016-12-17: qty 100

## 2016-12-17 MED ORDER — ALBUTEROL SULFATE (2.5 MG/3ML) 0.083% IN NEBU: 2.5000 mg | INHALATION_SOLUTION | Freq: Four times a day (QID) | RESPIRATORY_TRACT | Status: DC | PRN

## 2016-12-17 MED ORDER — SODIUM CHLORIDE 0.9% FLUSH: 3.0000 mL | Freq: Two times a day (BID) | INTRAVENOUS | Status: DC

## 2016-12-17 MED ORDER — ONDANSETRON HCL 4 MG PO TABS: 4.0000 mg | ORAL_TABLET | Freq: Four times a day (QID) | ORAL | Status: DC | PRN

## 2016-12-17 MED ORDER — ACETAMINOPHEN 325 MG PO TABS: 650.0000 mg | ORAL_TABLET | ORAL | Status: DC | PRN

## 2016-12-17 MED ORDER — ZOLPIDEM TARTRATE 5 MG PO TABS
5.0000 mg | ORAL_TABLET | Freq: Every evening | ORAL | Status: DC | PRN
  Administered 2016-12-17: 5 mg via ORAL
  Filled 2016-12-17: qty 1

## 2016-12-17 MED ORDER — HYDROCODONE-ACETAMINOPHEN 7.5-325 MG PO TABS: 1.0000 | ORAL_TABLET | ORAL | Status: DC | PRN

## 2016-12-17 MED ORDER — CITALOPRAM HYDROBROMIDE 20 MG PO TABS
20.0000 mg | ORAL_TABLET | Freq: Every day | ORAL | Status: DC
  Administered 2016-12-17 – 2016-12-18 (×2): 20 mg via ORAL
  Filled 2016-12-17 (×2): qty 1

## 2016-12-17 MED ORDER — CEFAZOLIN SODIUM-DEXTROSE 2-4 GM/100ML-% IV SOLN
2.0000 g | Freq: Three times a day (TID) | INTRAVENOUS | Status: AC
  Administered 2016-12-17 – 2016-12-18 (×2): 2 g via INTRAVENOUS
  Filled 2016-12-17 (×2): qty 100

## 2016-12-17 MED ORDER — BACITRACIN 50000 UNITS IM SOLR
INTRAMUSCULAR | Status: DC | PRN
  Administered 2016-12-17: 14:00:00

## 2016-12-17 MED ORDER — MIDAZOLAM HCL 5 MG/5ML IJ SOLN
INTRAMUSCULAR | Status: DC | PRN
  Administered 2016-12-17: 2 mg via INTRAVENOUS

## 2016-12-17 MED ORDER — ALBUTEROL SULFATE (2.5 MG/3ML) 0.083% IN NEBU
INHALATION_SOLUTION | RESPIRATORY_TRACT | Status: AC
  Filled 2016-12-17: qty 3

## 2016-12-17 MED ORDER — SODIUM CHLORIDE 0.9 % IV SOLN: 250.0000 mL | INTRAVENOUS | Status: DC

## 2016-12-17 MED ORDER — ROCURONIUM BROMIDE 10 MG/ML (PF) SYRINGE
PREFILLED_SYRINGE | INTRAVENOUS | Status: AC
  Filled 2016-12-17: qty 5

## 2016-12-17 MED ORDER — THROMBIN (RECOMBINANT) 5000 UNITS EX SOLR
CUTANEOUS | Status: DC | PRN
  Administered 2016-12-17: 14:00:00 via TOPICAL

## 2016-12-17 MED ORDER — DEXAMETHASONE SODIUM PHOSPHATE 10 MG/ML IJ SOLN
INTRAMUSCULAR | Status: AC
  Filled 2016-12-17: qty 1

## 2016-12-17 MED ORDER — VANCOMYCIN HCL 1000 MG IV SOLR
INTRAVENOUS | Status: AC
  Filled 2016-12-17: qty 1000

## 2016-12-17 MED ORDER — MIDAZOLAM HCL 2 MG/2ML IJ SOLN
INTRAMUSCULAR | Status: AC
  Filled 2016-12-17: qty 2

## 2016-12-17 MED ORDER — BISACODYL 10 MG RE SUPP: 10.0000 mg | Freq: Every day | RECTAL | Status: DC | PRN

## 2016-12-17 MED ORDER — OXYCODONE HCL 5 MG PO TABS
10.0000 mg | ORAL_TABLET | ORAL | Status: DC | PRN
  Administered 2016-12-17 – 2016-12-18 (×3): 10 mg via ORAL
  Filled 2016-12-17 (×3): qty 2

## 2016-12-17 MED ORDER — SUGAMMADEX SODIUM 200 MG/2ML IV SOLN
INTRAVENOUS | Status: DC | PRN
  Administered 2016-12-17: 200 mg via INTRAVENOUS

## 2016-12-17 MED ORDER — PHENOL 1.4 % MT LIQD: 1.0000 | OROMUCOSAL | Status: DC | PRN

## 2016-12-17 MED ORDER — ACETAMINOPHEN 650 MG RE SUPP
650.0000 mg | RECTAL | Status: DC | PRN
Start: 2016-12-17 — End: 2016-12-18

## 2016-12-17 MED ORDER — DOCUSATE SODIUM 100 MG PO CAPS
100.0000 mg | ORAL_CAPSULE | Freq: Two times a day (BID) | ORAL | Status: DC
  Administered 2016-12-17 – 2016-12-18 (×2): 100 mg via ORAL
  Filled 2016-12-17 (×2): qty 1

## 2016-12-17 MED ORDER — ALBUTEROL SULFATE (2.5 MG/3ML) 0.083% IN NEBU: 2.5000 mg | INHALATION_SOLUTION | Freq: Once | RESPIRATORY_TRACT | Status: DC

## 2016-12-17 MED ORDER — ROCURONIUM BROMIDE 100 MG/10ML IV SOLN
INTRAVENOUS | Status: DC | PRN
  Administered 2016-12-17: 20 mg via INTRAVENOUS
  Administered 2016-12-17: 50 mg via INTRAVENOUS
  Administered 2016-12-17: 30 mg via INTRAVENOUS

## 2016-12-17 MED ORDER — PROPOFOL 10 MG/ML IV BOLUS
INTRAVENOUS | Status: DC | PRN
  Administered 2016-12-17: 150 mg via INTRAVENOUS

## 2016-12-17 MED ORDER — BUPIVACAINE-EPINEPHRINE (PF) 0.5% -1:200000 IJ SOLN
INTRAMUSCULAR | Status: AC
  Filled 2016-12-17: qty 30

## 2016-12-17 MED ORDER — THROMBIN (RECOMBINANT) 5000 UNITS EX SOLR
CUTANEOUS | Status: AC
  Filled 2016-12-17: qty 5000

## 2016-12-17 MED ORDER — FENTANYL CITRATE (PF) 250 MCG/5ML IJ SOLN
INTRAMUSCULAR | Status: AC
  Filled 2016-12-17: qty 5

## 2016-12-17 MED ORDER — GLYCOPYRROLATE 0.2 MG/ML IJ SOLN
INTRAMUSCULAR | Status: DC | PRN
  Administered 2016-12-17: 0.2 mg via INTRAVENOUS

## 2016-12-17 MED ORDER — ROSUVASTATIN CALCIUM 5 MG PO TABS
10.0000 mg | ORAL_TABLET | Freq: Every day | ORAL | Status: DC
  Administered 2016-12-17: 10 mg via ORAL
  Filled 2016-12-17: qty 2

## 2016-12-17 MED ORDER — PHENYLEPHRINE HCL 10 MG/ML IJ SOLN
INTRAMUSCULAR | Status: DC | PRN
  Administered 2016-12-17: 35 ug/min via INTRAVENOUS

## 2016-12-17 MED ORDER — PHENYLEPHRINE HCL 10 MG/ML IJ SOLN
INTRAMUSCULAR | Status: DC | PRN
  Administered 2016-12-17 (×2): 80 ug via INTRAVENOUS

## 2016-12-17 MED ORDER — MENTHOL 3 MG MT LOZG
1.0000 | LOZENGE | OROMUCOSAL | Status: DC | PRN
Start: 2016-12-17 — End: 2016-12-18

## 2016-12-17 MED ORDER — METOPROLOL TARTRATE 25 MG PO TABS
50.0000 mg | ORAL_TABLET | Freq: Two times a day (BID) | ORAL | Status: DC
  Administered 2016-12-17: 50 mg via ORAL
  Filled 2016-12-17: qty 2

## 2016-12-17 MED ORDER — LIDOCAINE 2% (20 MG/ML) 5 ML SYRINGE
INTRAMUSCULAR | Status: AC
  Filled 2016-12-17: qty 5

## 2016-12-17 MED ORDER — LIDOCAINE HCL (CARDIAC) 20 MG/ML IV SOLN
INTRAVENOUS | Status: DC | PRN
  Administered 2016-12-17: 50 mg via INTRAVENOUS

## 2016-12-17 MED ORDER — ONDANSETRON HCL 4 MG/2ML IJ SOLN
INTRAMUSCULAR | Status: AC
  Filled 2016-12-17: qty 2

## 2016-12-17 MED ORDER — BUPIVACAINE-EPINEPHRINE (PF) 0.5% -1:200000 IJ SOLN
INTRAMUSCULAR | Status: DC | PRN
  Administered 2016-12-17: 10 mL via PERINEURAL

## 2016-12-17 MED ORDER — FENTANYL CITRATE (PF) 100 MCG/2ML IJ SOLN
INTRAMUSCULAR | Status: DC | PRN
  Administered 2016-12-17 (×2): 50 ug via INTRAVENOUS
  Administered 2016-12-17: 150 ug via INTRAVENOUS
  Administered 2016-12-17: 50 ug via INTRAVENOUS

## 2016-12-17 MED ORDER — PROPOFOL 10 MG/ML IV BOLUS
INTRAVENOUS | Status: AC
  Filled 2016-12-17: qty 20

## 2016-12-17 MED ORDER — THROMBIN (RECOMBINANT) 20000 UNITS EX SOLR
CUTANEOUS | Status: AC
  Filled 2016-12-17: qty 20000

## 2016-12-17 MED ORDER — BUPIVACAINE LIPOSOME 1.3 % IJ SUSP
20.0000 mL | INTRAMUSCULAR | Status: AC
  Administered 2016-12-17: 20 mL
  Filled 2016-12-17: qty 20

## 2016-12-17 MED ORDER — CYCLOBENZAPRINE HCL 10 MG PO TABS
10.0000 mg | ORAL_TABLET | Freq: Three times a day (TID) | ORAL | Status: DC | PRN
  Administered 2016-12-17 – 2016-12-18 (×2): 10 mg via ORAL
  Filled 2016-12-17 (×2): qty 1

## 2016-12-17 MED ORDER — SUCCINYLCHOLINE CHLORIDE 20 MG/ML IJ SOLN
INTRAMUSCULAR | Status: DC | PRN
  Administered 2016-12-17: 100 mg via INTRAVENOUS

## 2016-12-17 MED ORDER — MOMETASONE FURO-FORMOTEROL FUM 200-5 MCG/ACT IN AERO
2.0000 | INHALATION_SPRAY | Freq: Two times a day (BID) | RESPIRATORY_TRACT | Status: DC
  Filled 2016-12-17 (×2): qty 8.8

## 2016-12-17 MED ORDER — DEXAMETHASONE SODIUM PHOSPHATE 4 MG/ML IJ SOLN
INTRAMUSCULAR | Status: DC | PRN
  Administered 2016-12-17: 8 mg via INTRAVENOUS

## 2016-12-17 SURGICAL SUPPLY — 66 items
ADH SKN CLS APL DERMABOND .7 (GAUZE/BANDAGES/DRESSINGS) ×1
APL SKNCLS STERI-STRIP NONHPOA (GAUZE/BANDAGES/DRESSINGS) ×1
BAG DECANTER FOR FLEXI CONT (MISCELLANEOUS) ×2 IMPLANT
BASKET BONE COLLECTION (BASKET) ×1 IMPLANT
BENZOIN TINCTURE PRP APPL 2/3 (GAUZE/BANDAGES/DRESSINGS) ×2 IMPLANT
BLADE CLIPPER SURG (BLADE) IMPLANT
BUR MATCHSTICK NEURO 3.0 LAGG (BURR) ×2 IMPLANT
BUR PRECISION FLUTE 6.0 (BURR) ×2 IMPLANT
CANISTER SUCT 3000ML PPV (MISCELLANEOUS) ×2 IMPLANT
CAP REVERE LOCKING (Cap) ×4 IMPLANT
CARTRIDGE OIL MAESTRO DRILL (MISCELLANEOUS) ×1 IMPLANT
CONT SPEC 4OZ CLIKSEAL STRL BL (MISCELLANEOUS) ×2 IMPLANT
COVER BACK TABLE 60X90IN (DRAPES) ×4 IMPLANT
DERMABOND ADVANCED (GAUZE/BANDAGES/DRESSINGS) ×1
DERMABOND ADVANCED .7 DNX12 (GAUZE/BANDAGES/DRESSINGS) IMPLANT
DIFFUSER DRILL AIR PNEUMATIC (MISCELLANEOUS) ×2 IMPLANT
DRAPE C-ARM 42X72 X-RAY (DRAPES) ×4 IMPLANT
DRAPE HALF SHEET 40X57 (DRAPES) ×2 IMPLANT
DRAPE LAPAROTOMY 100X72X124 (DRAPES) ×2 IMPLANT
DRAPE POUCH INSTRU U-SHP 10X18 (DRAPES) ×2 IMPLANT
DRAPE SURG 17X23 STRL (DRAPES) ×8 IMPLANT
ELECT BLADE 4.0 EZ CLEAN MEGAD (MISCELLANEOUS) ×2
ELECT REM PT RETURN 9FT ADLT (ELECTROSURGICAL) ×2
ELECTRODE BLDE 4.0 EZ CLN MEGD (MISCELLANEOUS) ×1 IMPLANT
ELECTRODE REM PT RTRN 9FT ADLT (ELECTROSURGICAL) ×1 IMPLANT
EVACUATOR 1/8 PVC DRAIN (DRAIN) IMPLANT
GAUZE SPONGE 4X4 12PLY STRL (GAUZE/BANDAGES/DRESSINGS) ×2 IMPLANT
GAUZE SPONGE 4X4 16PLY XRAY LF (GAUZE/BANDAGES/DRESSINGS) ×2 IMPLANT
GLOVE BIO SURGEON STRL SZ8 (GLOVE) ×5 IMPLANT
GLOVE BIO SURGEON STRL SZ8.5 (GLOVE) ×5 IMPLANT
GLOVE ECLIPSE 9.0 STRL (GLOVE) ×1 IMPLANT
GLOVE EXAM NITRILE LRG STRL (GLOVE) IMPLANT
GLOVE EXAM NITRILE XL STR (GLOVE) IMPLANT
GLOVE EXAM NITRILE XS STR PU (GLOVE) IMPLANT
GOWN STRL REUS W/ TWL LRG LVL3 (GOWN DISPOSABLE) IMPLANT
GOWN STRL REUS W/ TWL XL LVL3 (GOWN DISPOSABLE) ×2 IMPLANT
GOWN STRL REUS W/TWL 2XL LVL3 (GOWN DISPOSABLE) IMPLANT
GOWN STRL REUS W/TWL LRG LVL3 (GOWN DISPOSABLE) ×2
GOWN STRL REUS W/TWL XL LVL3 (GOWN DISPOSABLE) ×8
KIT BASIN OR (CUSTOM PROCEDURE TRAY) ×2 IMPLANT
KIT ROOM TURNOVER OR (KITS) ×2 IMPLANT
NDL HYPO 21X1.5 SAFETY (NEEDLE) IMPLANT
NEEDLE HYPO 21X1.5 SAFETY (NEEDLE) ×2 IMPLANT
NEEDLE HYPO 22GX1.5 SAFETY (NEEDLE) ×2 IMPLANT
NS IRRIG 1000ML POUR BTL (IV SOLUTION) ×2 IMPLANT
OIL CARTRIDGE MAESTRO DRILL (MISCELLANEOUS) ×2
PACK LAMINECTOMY NEURO (CUSTOM PROCEDURE TRAY) ×2 IMPLANT
PAD ARMBOARD 7.5X6 YLW CONV (MISCELLANEOUS) ×6 IMPLANT
PATTIES SURGICAL .5 X1 (DISPOSABLE) IMPLANT
ROD REVERE 6.35 40MM (Rod) ×2 IMPLANT
SCREW REVERE 6.35 6.5MMX45 (Screw) ×4 IMPLANT
SPACER ALTERA 10X31-15 (Spacer) ×1 IMPLANT
SPONGE LAP 4X18 X RAY DECT (DISPOSABLE) IMPLANT
SPONGE NEURO XRAY DETECT 1X3 (DISPOSABLE) IMPLANT
SPONGE SURGIFOAM ABS GEL 100 (HEMOSTASIS) ×1 IMPLANT
STRIP BIOACTIVE 20CC 25X100X8 (Miscellaneous) ×1 IMPLANT
STRIP CLOSURE SKIN 1/2X4 (GAUZE/BANDAGES/DRESSINGS) ×2 IMPLANT
SUT VIC AB 1 CT1 18XBRD ANBCTR (SUTURE) ×2 IMPLANT
SUT VIC AB 1 CT1 8-18 (SUTURE) ×4
SUT VIC AB 2-0 CP2 18 (SUTURE) ×4 IMPLANT
SYR 20CC LL (SYRINGE) ×1 IMPLANT
TAPE CLOTH SURG 4X10 WHT LF (GAUZE/BANDAGES/DRESSINGS) ×1 IMPLANT
TOWEL GREEN STERILE (TOWEL DISPOSABLE) ×2 IMPLANT
TOWEL GREEN STERILE FF (TOWEL DISPOSABLE) ×2 IMPLANT
TRAY FOLEY W/METER SILVER 16FR (SET/KITS/TRAYS/PACK) ×2 IMPLANT
WATER STERILE IRR 1000ML POUR (IV SOLUTION) ×2 IMPLANT

## 2016-12-17 NOTE — H&P (Signed)
Subjective: The patient is a 61 year old white female who has complained of back, buttock and leg pain consistent with neurogenic claudication.  She has failed medical management and was worked up with a lumbar MRI.  This demonstrated an L4-5 spondylolisthesis, facet arthropathy and spinal stenosis.  I discussed the situation with the patient.  We discussed the various treatment options.  She has decided proceed with surgery.  Past Medical History:  Diagnosis Date  . Allergy    sinus problems  . Depression   . Emphysema of lung (HCC)   . GERD (gastroesophageal reflux disease)   . GI bleeding   . Heart murmur    Dr. Dulce SellarMunley  . Hypertension   . Intentional weight change   . Ischemic colitis (HCC)   . Osteoporosis   . Pneumonia    hx of pneumonia after surgery  . PONV (postoperative nausea and vomiting)   . Sleep apnea    CPAP    Past Surgical History:  Procedure Laterality Date  . ABDOMINAL HYSTERECTOMY    . BACK SURGERY     c-3 and c-4  . CHOLECYSTECTOMY    . coloctomy     5' of intesting removed  . COLONOSCOPY    . ESOPHAGOGASTRODUODENOSCOPY      Allergies  Allergen Reactions  . Demerol [Meperidine] Nausea And Vomiting and Other (See Comments)    "quit breathing" - 30 years ago  . Altace [Ramipril] Other (See Comments)    Cough      Social History   Tobacco Use  . Smoking status: Former Smoker    Last attempt to quit: 12/12/2016    Years since quitting: 0.0  . Smokeless tobacco: Never Used  Substance Use Topics  . Alcohol use: No    Family History  Problem Relation Age of Onset  . Lupus Mother   . Diabetes Father   . Hypertension Father   . Hypertension Brother   . Colon cancer Paternal Grandfather   . Stroke Paternal Grandfather    Prior to Admission medications   Medication Sig Start Date End Date Taking? Authorizing Provider  albuterol (PROVENTIL) (2.5 MG/3ML) 0.083% nebulizer solution Take 2.5 mg by nebulization every 6 (six) hours as needed for  wheezing.   Yes [provider]  Ascorbic Acid (VITAMIN C) 1000 MG tablet Take 1,000 mg by mouth daily.   Yes [provider]  aspirin EC 81 MG tablet Take 81 mg daily by mouth.   Yes [provider]  budesonide-formoterol (SYMBICORT) 160-4.5 MCG/ACT inhaler Inhale 2 puffs 2 (two) times daily into the lungs.   Yes [provider]  Calcium Carb-Cholecalciferol (CALCIUM 600 + D) 600-200 MG-UNIT TABS Take 1 tablet by mouth daily.   Yes [provider]  citalopram (CELEXA) 20 MG tablet Take 20 mg by mouth daily.   Yes [provider]  fish oil-omega-3 fatty acids 1000 MG capsule Take 2 g by mouth daily.   Yes [provider]  HYDROcodone-acetaminophen (NORCO) 7.5-325 MG tablet Take 1 tablet every 6 (six) hours as needed by mouth for moderate pain.   Yes [provider]  metoprolol (LOPRESSOR) 50 MG tablet Take 50 mg by mouth 2 (two) times daily.   Yes [provider]  ranitidine (ZANTAC) 150 MG tablet Take 150 mg by mouth at bedtime.    Yes [provider]  rosuvastatin (CRESTOR) 10 MG tablet Take 10 mg by mouth daily.   Yes [provider]  verapamil (COVERA HS) 240 MG (  CO) 24 hr tablet Take 240 mg by mouth 2 (two) times daily.    Yes [provider]  zolpidem (AMBIEN) 5 MG tablet Take 5 mg by mouth at bedtime as needed for sleep.   Yes [provider]  ciprofloxacin (CIPRO) 500 MG tablet Take 1 tablet (500 mg total) by mouth 2 (two) times daily. 03/24/13   Tommie Sams, DO  esomeprazole (NEXIUM) 40 MG capsule Take 40 mg by mouth daily at 12 noon.    [provider]  metroNIDAZOLE (FLAGYL) 500 MG tablet Take 1 tablet (500 mg total) by mouth 3 (three) times daily. 03/23/13   Tommie Sams, DO  oxyCODONE-acetaminophen (PERCOCET/ROXICET) 5-325 MG per tablet Take 1-2 tablets by mouth every 3 (three) hours as needed for moderate pain or severe pain. 03/23/13   Tommie Sams, DO   ramipril (ALTACE) 10 MG capsule Take 10 mg by mouth 2 (two) times daily.    [provider]     Review of Systems  Positive ROS: As above  All other systems have been reviewed and were otherwise negative with the exception of those mentioned in the HPI and as above.  Objective: Vital signs in last 24 hours: Temp:  [98.8 F (37.1 C)] 98.8 F (37.1 C) (11/19 1034) Pulse Rate:  [59] 59 (11/19 1034) Resp:  [18] 18 (11/19 1034) BP: (121)/(69) 121/69 (11/19 1034) SpO2:  [95 %] 95 % (11/19 1034) Weight:  [87.1 kg (192 lb)] 87.1 kg (192 lb) (11/19 1034)  General Appearance: Alert Head: Normocephalic, without obvious abnormality, atraumatic Eyes: PERRL, conjunctiva/corneas clear, EOM's intact,    Ears: Normal  Throat: Normal  Neck: Supple, Back: unremarkable Lungs: Clear to auscultation bilaterally, respirations unlabored Heart: Regular rate and rhythm, no murmur, rub or gallop Abdomen: Soft, non-tender Extremities: Extremities normal, atraumatic, no cyanosis or edema Skin: unremarkable  NEUROLOGIC:   Mental status: alert and oriented,Motor Exam - grossly normal Sensory Exam - grossly normal Reflexes:  Coordination - grossly normal Gait - grossly normal Balance - grossly normal Cranial Nerves: I: smell Not tested  II: visual acuity  OS: Normal  OD: Normal   II: visual fields Full to confrontation  II: pupils Equal, round, reactive to light  III,VII: ptosis None  III,IV,VI: extraocular muscles  Full ROM  V: mastication Normal  V: facial light touch sensation  Normal  V,VII: corneal reflex  Present  VII: facial muscle function - upper  Normal  VII: facial muscle function - lower Normal  VIII: hearing Not tested  IX: soft palate elevation  Normal  IX,X: gag reflex Present  XI: trapezius strength  5/5  XI: sternocleidomastoid strength 5/5  XI: neck flexion strength  5/5  XII: tongue strength  Normal    Data Review Lab Results  Component Value Date   WBC  12.8 (H) 12/17/2016   HGB 15.8 (H) 12/17/2016   HCT 47.1 (H) 12/17/2016   MCV 94.6 12/17/2016   PLT 333 12/17/2016   Lab Results  Component Value Date   NA 141 12/17/2016   K 3.8 12/17/2016   CL 111 12/17/2016   CO2 22 12/17/2016   BUN 18 12/17/2016   CREATININE 0.78 12/17/2016   GLUCOSE 99 12/17/2016   No results found for: INR, PROTIME  Assessment/Plan: L4-5 spondylolisthesis, facet arthropathy, spinal stenosis, neurogenic claudication, lumbago, lumbar radiculopathy: I have discussed the situation with the patient.  I have reviewed her imaging studies with her and pointed out the abnormalities.  We have discussed  the various treatment options including surgery.  I have described the surgical treatment option of an L4-5 decompression, instrumentation, and fusion.  I have shown her surgical models.  I have given her a surgical pamphlet.  We have discussed the risks, benefits, alternatives, expected postoperative course, and likelihood of achieving our goals with surgery.  I have answered all her questions.  She has decided to proceed with surgery.   Cristi LoronJeffrey D Avery Klingbeil 12/17/2016 12:59 PM

## 2016-12-17 NOTE — Progress Notes (Signed)
Patient ID: [name], female   DOB: 11-Aug-1955, 61 y.o.   MRN: [id number] Subjective: The patient is alert and pleasant.  She is in no apparent distress.  She looks well.  Objective: Vital signs in last 24 hours: Temp:  [98.3 F (36.8 C)-98.8 F (37.1 C)] 98.3 F (36.8 C) (11/19 1635) Pulse Rate:  [59-86] 76 (11/19 1720) Resp:  [12-20] 13 (11/19 1720) BP: (94-121)/(53-88) 101/66 (11/19 1720) SpO2:  [92 %-96 %] 95 % (11/19 1720) Weight:  [87.1 kg (192 lb)] 87.1 kg (192 lb) (11/19 1034)  Intake/Output from previous day: No intake/output data recorded. Intake/Output this shift: Total I/O In: 1500 [I.V.:1500] Out: 500 [Urine:250; Blood:250]  Physical exam the patient is alert and pleasant.  She is moving her lower extremities well.  Lab Results: Recent Labs    12/17/16 1023  WBC 12.8*  HGB 15.8*  HCT 47.1*  PLT 333   BMET Recent Labs    12/17/16 1023  NA 141  K 3.8  CL 111  CO2 22  GLUCOSE 99  BUN 18  CREATININE 0.78  CALCIUM 9.1    Studies/Results: Dg Lumbar Spine 2-3 Views  Result Date: 12/17/2016 CLINICAL DATA:  Portable operative imaging for lumbar spine fusion. EXAM: LUMBAR SPINE - 2-3 VIEW; DG C-ARM 61-120 MIN COMPARISON:  None. FINDINGS: The 2 submitted views demonstrate placement of pedicle screws at L4-L5, which appear well seated and well-positioned. There is a well-centered intervertebral metal cage at L4-L5, maintaining disc height. IMPRESSION: Well-positioned orthopedic hardware for posterior L4-L5 lumbar spine fusion. Electronically Signed   By: Amie Portlandavid  Ormond M.D.   On: 12/17/2016 16:17   Dg Lumbar Spine 1 View  Result Date: 12/17/2016 CLINICAL DATA:  L4-5 fusion EXAM: LUMBAR SPINE - 1 VIEW COMPARISON:  11/28/2016 FINDINGS: Posterior surgical instruments are directed at the L4-5 level. IMPRESSION: Intraoperative localization of L4-5. Electronically Signed   By: Charlett NoseKevin  Dover M.D.   On: 12/17/2016 15:19   Dg C-arm 1-60 Min  Result Date:  12/17/2016 CLINICAL DATA:  Portable operative imaging for lumbar spine fusion. EXAM: LUMBAR SPINE - 2-3 VIEW; DG C-ARM 61-120 MIN COMPARISON:  None. FINDINGS: The 2 submitted views demonstrate placement of pedicle screws at L4-L5, which appear well seated and well-positioned. There is a well-centered intervertebral metal cage at L4-L5, maintaining disc height. IMPRESSION: Well-positioned orthopedic hardware for posterior L4-L5 lumbar spine fusion. Electronically Signed   By: Amie Portlandavid  Ormond M.D.   On: 12/17/2016 16:17    Assessment/Plan: The patient is doing well.  I spoke with her family.  LOS: 0 days     Cristi LoronJeffrey D Zayin Valadez 12/17/2016, 5:35 PM

## 2016-12-17 NOTE — Plan of Care (Signed)
  Progressing Safety: Ability to remain free from injury will improve 12/17/2016 2259 - Progressing by Cephus RicherAguilar, Daniyah Fohl D, RN Activity: Ability to avoid complications of mobility impairment will improve 12/17/2016 2259 - Progressing by Mel AlmondAguilar, Lendon George D, RN Ability to tolerate increased activity will improve 12/17/2016 2259 - Progressing by Mel AlmondAguilar, Suraiya Dickerson D, RN Will remain free from falls 12/17/2016 2259 - Progressing by Threasa BeardsAguilar, Tiaja Hagan D, RN Bowel/Gastric: Gastrointestinal status for postoperative course will improve 12/17/2016 2259 - Progressing by Mel AlmondAguilar, Arrian Manson D, RN Education: Ability to verbalize activity precautions or restrictions will improve 12/17/2016 2259 - Progressing by Threasa BeardsAguilar, Adeola Dennen D, RN Knowledge of the prescribed therapeutic regimen will improve 12/17/2016 2259 - Progressing by Mel AlmondAguilar, Kloe Oates D, RN Understanding of discharge needs will improve 12/17/2016 2259 - Progressing by Mel AlmondAguilar, Eulogio Requena D, RN Physical Regulation: Ability to maintain clinical measurements within normal limits will improve 12/17/2016 2259 - Progressing by Cephus RicherAguilar, Phinehas Grounds D, RN Postoperative complications will be avoided or minimized 12/17/2016 2259 - Progressing by Mel AlmondAguilar, Peightyn Roberson D, RN Diagnostic test results will improve 12/17/2016 2259 - Progressing by Threasa BeardsAguilar, Jorell Agne D, RN Pain Management: Pain level will decrease 12/17/2016 2259 - Progressing by Mel AlmondAguilar, Analei Whinery D, RN Skin Integrity: Signs of wound healing will improve 12/17/2016 2259 - Progressing by Threasa BeardsAguilar, Isabela Nardelli D, RN Health Behavior/Discharge Planning: Identification of resources available to assist in meeting health care needs will improve 12/17/2016 2259 - Progressing by Threasa BeardsAguilar, Sheyann Sulton D, RN Bladder/Genitourinary: Urinary functional status for postoperative course will improve 12/17/2016 2259 - Progressing by Mel AlmondAguilar, Carizma Dunsworth D, RN

## 2016-12-17 NOTE — Progress Notes (Signed)
Breathing treatment given per Dr Thomasene LotGreen's orders.

## 2016-12-17 NOTE — Transfer of Care (Signed)
Immediate Anesthesia Transfer of Care Note  Patient: [name]  Procedure(s) Performed: POSTERIOR LUMBAR INTERBODY FUSION, INTERBODY PROSTHESIS, POSTERIOR INSTRUMENTATION LUMBAR FOUR-FIVE (N/A )  Patient Location: PACU  Anesthesia Type:General  Level of Consciousness: awake  Airway & Oxygen Therapy: Patient Spontanous Breathing and Patient connected to nasal cannula oxygen  Post-op Assessment: Report given to RN and Post -op Vital signs reviewed and stable  Post vital signs: Reviewed and stable  Last Vitals:  Vitals:   12/17/16 1034 12/17/16 1635  BP: 121/69 108/88  Pulse: (!) 59 86  Resp: 18 12  Temp: 37.1 C 36.8 C  SpO2: 95% 96%    Last Pain:  Vitals:   12/17/16 1034  TempSrc: Oral      Patients Stated Pain Goal: 5 (12/17/16 1046)  Complications: No apparent anesthesia complications

## 2016-12-17 NOTE — Anesthesia Preprocedure Evaluation (Addendum)
Anesthesia Evaluation  Patient identified by MRN, date of birth, ID band Patient awake    Reviewed: Allergy & Precautions, NPO status , Patient's Chart, lab work & pertinent test results  History of Anesthesia Complications (+) PONV  Airway Mallampati: II  TM Distance: >3 FB     Dental  (+) Edentulous Upper, Dental Advisory Given, Partial Lower   Pulmonary sleep apnea , pneumonia, COPD, former smoker,     + wheezing      Cardiovascular hypertension,  Rhythm:Regular Rate:Normal     Neuro/Psych    GI/Hepatic Neg liver ROS, GERD  ,  Endo/Other  negative endocrine ROS  Renal/GU negative Renal ROS     Musculoskeletal   Abdominal   Peds  Hematology   Anesthesia Other Findings   Reproductive/Obstetrics                           Anesthesia Physical Anesthesia Plan  ASA: III  Anesthesia Plan: General   Post-op Pain Management:    Induction: Intravenous  PONV Risk Score and Plan: 4 or greater and Treatment may vary due to age or medical condition, Ondansetron and Dexamethasone  Airway Management Planned:   Additional Equipment:   Intra-op Plan:   Post-operative Plan: Possible Post-op intubation/ventilation  Informed Consent:   Dental advisory given  Plan Discussed with: CRNA and Anesthesiologist  Anesthesia Plan Comments:        Anesthesia Quick Evaluation

## 2016-12-17 NOTE — Op Note (Signed)
Brief history: The patient is a 61 year old white female who has complained of back, buttock and leg pain consistent with neurogenic claudication.  She has failed medical management and was worked up with a lumbar MRI and lumbar x-rays.  These demonstrated an L4-5 spondylolisthesis, facet arthropathy and spinal stenosis.  I discussed the various treatment options with the patient including surgery.  She has weighed the risks, benefits, and alternatives of surgery and decided to proceed with a L4-5 decompression, instrumentation, and fusion.  Preoperative diagnosis: L4-5 spondylolisthesis, facet arthropathy, spinal stenosis compressing both the L4 and the L5 nerve roots; lumbago; lumbar radiculopathy  Postoperative diagnosis: The same  Procedure: Bilateral L4-5 laminotomy/foraminotomies to decompress the bilateral L4 and L5 nerve roots(the work required to do this was in addition to the work required to do the posterior lumbar interbody fusion because of the patient's spinal stenosis, facet arthropathy. Etc. requiring a wide decompression of the nerve roots.);  L4-5 transforaminal lumbar interbody fusion with local morselized autograft bone and Kinnex graft extender; insertion of interbody prosthesis at L4-5 (globus peek expandable interbody prosthesis); posterior nonsegmental instrumentation from L4 to L5 with globus titanium pedicle screws and rods; posterior lateral arthrodesis at L4-5 with local morselized autograft bone and Kinnex bone graft extender.  Surgeon: [name]  Asst.: Dr. Jordan LikesPool  Anesthesia: Gen. endotracheal  Estimated blood loss: 200 cc  Drains: None  Complications: None  Description of procedure: The patient was brought to the operating room by the anesthesia team. General endotracheal anesthesia was induced. The patient was turned to the prone position on the Wilson frame. The patient's lumbosacral region was then prepared with Betadine scrub and Betadine solution. Sterile  drapes were applied.  I then injected the area to be incised with Marcaine with epinephrine solution. I then used the scalpel to make a linear midline incision over the L4-5 interspace. I then used electrocautery to perform a bilateral subperiosteal dissection exposing the spinous process and lamina of L4 and L5. We then obtained intraoperative radiograph to confirm our location. We then inserted the Verstrac retractor to provide exposure.  I began the decompression by using the high speed drill to perform laminotomies at L4-5 bilaterally. We then used the Kerrison punches to widen the laminotomy and removed the ligamentum flavum at L4-5 bilaterally. We used the Kerrison punches to remove the medial facets at L4-5 bilaterally. We performed wide foraminotomies about the bilateral L4 and L5 nerve roots completing the decompression.  We now turned our attention to the posterior lumbar interbody fusion. I used a scalpel to incise the intervertebral disc at L4-5 bilaterally. I then performed a partial intervertebral discectomy at L4-5 bilaterally using the pituitary forceps. We prepared the vertebral endplates at L4-5 bilaterally for the fusion by removing the soft tissues with the curettes. We then used the trial spacers to pick the appropriate sized interbody prosthesis. We prefilled his prosthesis with a combination of local morselized autograft bone that we obtained during the decompression as well as Kinnex bone graft extender. We inserted the prefilled prosthesis into the interspace at L4-5, we then expanded the prosthesis. There was a good snug fit of the prosthesis in the interspace. We then filled and the remainder of the intervertebral disc space with local morselized autograft bone and Kinnex. This completed the posterior lumbar interbody arthrodesis.  We now turned attention to the instrumentation. Under fluoroscopic guidance we cannulated the bilateral L4 and L5 pedicles with the bone probe. We then  removed the bone probe. We then  tapped the pedicle with a 5.5 millimeter tap. We then removed the tap. We probed inside the tapped pedicle with a ball probe to rule out cortical breaches. We then inserted a 0.5 x 45 millimeter pedicle screw into the L4 and L5 pedicles bilaterally under fluoroscopic guidance. We then palpated along the medial aspect of the pedicles to rule out cortical breaches. There were none. The nerve roots were not injured. We then connected the unilateral pedicle screws with a lordotic rod. We compressed the construct and secured the rod in place with the caps. We then tightened the caps appropriately. This completed the instrumentation from L4-5.  We now turned our attention to the posterior lateral arthrodesis at L4-5 bilaterally. We used the high-speed drill to decorticate the remainder of the facets, pars, transverse process at L4-5 bilaterally. We then applied a combination of local morselized autograft bone and Kinnex bone graft extender over these decorticated posterior lateral structures. This completed the posterior lateral arthrodesis.  We then obtained hemostasis using bipolar electrocautery. We irrigated the wound out with bacitracin solution. We inspected the thecal sac and nerve roots and noted they were well decompressed. We then removed the retractor. We placed vancomycin powder in the wound. We reapproximated patient's thoracolumbar fascia with interrupted #1 Vicryl suture. We reapproximated patient's subcutaneous tissue with interrupted 2-0 Vicryl suture. The reapproximated patient's skin with Steri-Strips and benzoin. The wound was then coated with bacitracin ointment. A sterile dressing was applied. The drapes were removed. The patient was subsequently returned to the supine position where they were extubated by the anesthesia team. He was then transported to the post anesthesia care unit in stable condition. All sponge instrument and needle counts were reportedly  correct at the end of this case.

## 2016-12-17 NOTE — Anesthesia Postprocedure Evaluation (Signed)
Anesthesia Post Note  Patient: [name]  Procedure(s) Performed: POSTERIOR LUMBAR INTERBODY FUSION, INTERBODY PROSTHESIS, POSTERIOR INSTRUMENTATION LUMBAR FOUR-FIVE (N/A )     Patient location during evaluation: PACU Anesthesia Type: General Level of consciousness: awake Pain management: pain level controlled Vital Signs Assessment: post-procedure vital signs reviewed and stable Respiratory status: spontaneous breathing Cardiovascular status: stable Anesthetic complications: no    Last Vitals:  Vitals:   12/17/16 1034 12/17/16 1635  BP: 121/69 108/88  Pulse: (!) 59 86  Resp: 18 12  Temp: 37.1 C 36.8 C  SpO2: 95% 96%    Last Pain:  Vitals:   12/17/16 1034  TempSrc: Oral                 Kaylor Simenson

## 2016-12-17 NOTE — Anesthesia Procedure Notes (Signed)
Procedure Name: Intubation Date/Time: 12/17/2016 1:06 PM Performed by: Lavell Luster, CRNA Pre-anesthesia Checklist: Patient identified, Emergency Drugs available, Suction available, Patient being monitored and Timeout performed Patient Re-evaluated:Patient Re-evaluated prior to induction Oxygen Delivery Method: Circle system utilized Preoxygenation: Pre-oxygenation with 100% oxygen Induction Type: IV induction Ventilation: Mask ventilation without difficulty Laryngoscope Size: Mac and 3 Grade View: Grade I Tube type: Oral Tube size: 7.0 mm Number of attempts: 1 Airway Equipment and Method: Stylet Placement Confirmation: ETT inserted through vocal cords under direct vision,  positive ETCO2 and breath sounds checked- equal and bilateral Secured at: 21 cm Tube secured with: Tape Dental Injury: Teeth and Oropharynx as per pre-operative assessment

## 2016-12-18 LAB — BASIC METABOLIC PANEL
Anion gap: 10 (ref 5–15)
BUN: 14 mg/dL (ref 6–20)
CO2: 22 mmol/L (ref 22–32)
Calcium: 8.8 mg/dL — ABNORMAL LOW (ref 8.9–10.3)
Chloride: 108 mmol/L (ref 101–111)
Creatinine, Ser: 0.77 mg/dL (ref 0.44–1.00)
GFR calc Af Amer: >60 mL/min (ref >60)
GFR calc non Af Amer: >60 mL/min (ref >60)
Glucose, Bld: 118 mg/dL — ABNORMAL HIGH (ref 65–99)
Potassium: 4.3 mmol/L (ref 3.5–5.1)
Sodium: 140 mmol/L (ref 135–145)

## 2016-12-18 LAB — CBC
HCT: 41.3 % (ref 36.0–46.0)
Hemoglobin: 13.4 g/dL (ref 12.0–15.0)
MCH: 31.2 pg (ref 26.0–34.0)
MCHC: 32.4 g/dL (ref 30.0–36.0)
MCV: 96.3 fL (ref 78.0–100.0)
Platelets: 263 10*3/uL (ref 150–400)
RBC: 4.29 MIL/uL (ref 3.87–5.11)
RDW: 13.6 % (ref 11.5–15.5)
WBC: 20.5 10*3/uL — ABNORMAL HIGH (ref 4.0–10.5)

## 2016-12-18 MED ORDER — VERAPAMIL HCL ER 240 MG PO TBCR
240.0000 mg | EXTENDED_RELEASE_TABLET | Freq: Two times a day (BID) | ORAL | Status: DC
  Filled 2016-12-18: qty 1

## 2016-12-18 MED ORDER — OXYCODONE HCL 10 MG PO TABS: 10.0000 mg | ORAL_TABLET | ORAL | 0 refills | Status: DC | PRN

## 2016-12-18 MED ORDER — DOCUSATE SODIUM 100 MG PO CAPS: 100.0000 mg | ORAL_CAPSULE | Freq: Two times a day (BID) | ORAL | 0 refills | Status: AC

## 2016-12-18 MED ORDER — CYCLOBENZAPRINE HCL 10 MG PO TABS: 10.0000 mg | ORAL_TABLET | Freq: Three times a day (TID) | ORAL | 1 refills | Status: DC | PRN

## 2016-12-18 NOTE — Progress Notes (Signed)
Patient alert and oriented, mae's well, voiding adequate amount of urine, swallowing without difficulty, no c/o pain at time of discharge. Patient discharged home with family. Script and discharged instructions given to patient. Patient and family stated understanding of instructions given. Patient has an appointment with Dr. Jenkins   

## 2016-12-18 NOTE — Evaluation (Signed)
Physical Therapy Evaluation Patient Details Name: [name] MRN: [id number] DOB: 13-May-1955 Today's Date: 12/18/2016   History of Present Illness  patient is s/p L4-5 decompression, instrumentation, and fusion on patient on 12/17/2016  Clinical Impression  Patient seen for mobility assessment s/p spinal surgery. Mobilize well. Educated patient on precautions, mobility expectations, safety and car transfers. No further acute PT needs. Will sign off.     Follow Up Recommendations Supervision/Assistance - 24 hour    Equipment Recommendations  None recommended by PT    Recommendations for Other Services       Precautions / Restrictions Precautions Precautions: Back Precaution Booklet Issued: Yes (comment) Precaution Comments: reviewed with patient Required Braces or Orthoses: Spinal Brace Spinal Brace: Lumbar corset Restrictions Weight Bearing Restrictions: No      Mobility  Bed Mobility Overal bed mobility: Modified Independent             General bed mobility comments: no physical assist, good technique  Transfers Overall transfer level: Needs assistance Equipment used: Rolling walker (2 wheeled) Transfers: Sit to/from Stand Sit to Stand: Supervision         General transfer comment: superivsion for safety, relaince on RW  Ambulation/Gait Ambulation/Gait assistance: Supervision Ambulation Distance (Feet): 210 Feet Assistive device: Rolling walker (2 wheeled) Gait Pattern/deviations: Step-through pattern;Decreased stride length;Trunk flexed Gait velocity: decreased Gait velocity interpretation: Below normal speed for age/gender General Gait Details: steady with use of RW  Stairs Stairs: Yes Stairs assistance: Supervision Stair Management: Step to pattern;Sideways Number of Stairs: 6 General stair comments: performed x2  Wheelchair Mobility    Modified Rankin (Stroke Patients Only)       Balance Overall balance assessment: No  apparent balance deficits (not formally assessed)                                           Pertinent Vitals/Pain Pain Assessment: Faces Faces Pain Scale: Hurts even more Pain Location: low back Pain Descriptors / Indicators: Operative site guarding Pain Intervention(s): Monitored during session    Home Living Family/patient expects to be discharged to:: Private residence Living Arrangements: Spouse/significant other Available Help at Discharge: Family;Available 24 hours/day Type of Home: Mobile home Home Access: Stairs to enter Entrance Stairs-Rails: Can reach both Entrance Stairs-Number of Steps: 3 Home Layout: One level Home Equipment: Walker - 2 wheels      Prior Function Level of Independence: Independent with assistive device(s)               Hand Dominance   Dominant Hand: Right    Extremity/Trunk Assessment   Upper Extremity Assessment Upper Extremity Assessment: Generalized weakness    Lower Extremity Assessment Lower Extremity Assessment: Generalized weakness    Cervical / Trunk Assessment Cervical / Trunk Assessment: (s/p spinal surgery)  Communication   Communication: No difficulties  Cognition Arousal/Alertness: Awake/alert Behavior During Therapy: WFL for tasks assessed/performed Overall Cognitive Status: Within Functional Limits for tasks assessed                                        General Comments      Exercises     Assessment/Plan    PT Assessment Patent does not need any further PT services  PT Problem List Decreased strength;Decreased activity tolerance;Decreased balance;Decreased mobility;Decreased coordination;Decreased knowledge of  precautions;Pain       PT Treatment Interventions      PT Goals (Current goals can be found in the Care Plan section)  Acute Rehab PT Goals PT Goal Formulation: All assessment and education complete, DC therapy    Frequency     Barriers to discharge         Co-evaluation               AM-PAC PT "6 Clicks" Daily Activity  Outcome Measure Difficulty turning over in bed (including adjusting bedclothes, sheets and blankets)?: A Little Difficulty moving from lying on back to sitting on the side of the bed? : A Little Difficulty sitting down on and standing up from a chair with arms (e.g., wheelchair, bedside commode, etc,.)?: A Little Help needed moving to and from a bed to chair (including a wheelchair)?: A Little Help needed walking in hospital room?: A Little Help needed climbing 3-5 steps with a railing? : A Little 6 Click Score: 18    End of Session Equipment Utilized During Treatment: Back brace Activity Tolerance: Patient tolerated treatment well Patient left: in bed;with call bell/phone within reach;with family/visitor present Nurse Communication: Mobility status PT Visit Diagnosis: Muscle weakness (generalized) (M62.81);Difficulty in walking, not elsewhere classified (R26.2)    Time: [id number] PT Time Calculation (min) (ACUTE ONLY): 21 min   Charges:   PT Evaluation $PT Eval Low Complexity: 1 Low     PT G Codes:   PT G-Codes **NOT FOR INPATIENT CLASS** Functional Assessment Tool Used: Clinical judgement Functional Limitation: Mobility: Walking and moving around Mobility: Walking and Moving Around Current Status ([id number](G8978): At least 1 percent but less than 20 percent impaired, limited or restricted Mobility: Walking and Moving Around Goal Status [phone](G8979): At least 1 percent but less than 20 percent impaired, limited or restricted Mobility: Walking and Moving Around Discharge Status [phone](G8980): At least 1 percent but less than 20 percent impaired, limited or restricted    Charlotte Crumbevon Aldea Avis, PT DPT  Board Certified Neurologic Specialist [phone]   Fabio AsaDevon J Jett Kulzer 12/18/2016, 9:34 AM

## 2016-12-18 NOTE — Discharge Summary (Signed)
Physician Discharge Summary  Patient ID: [name] MRN: [id number] DOB/AGE: [birth year] 61 y.o.  Admit date: 12/17/2016 Discharge date: 12/18/2016  Admission Diagnoses: L4-5 spondylolisthesis, spinal stenosis, lumbago, lumbar radiculopathy, neurogenic claudication  Discharge Diagnoses: The same  Discharged Condition: good  Hospital Course: I performed an L4-5 decompression, instrumentation, and fusion on patient on 12/17/2016.  The surgery went well.  The patient's postoperative course was unremarkable.  On postoperative day #1 she requested discharge to home.  She was given written and oral discharge instructions.  All questions were answered.  Consults: Physical therapy Significant Diagnostic Studies: None Treatments: L4-5 decompression, instrumentation, and fusion. Discharge Exam: Blood pressure 103/63, pulse 79, temperature 98 F (36.7 C), temperature source Oral, resp. rate 18, height 5\' 2"  (1.575 m), weight 87.1 kg (192 lb), SpO2 97 %. The patient is alert and pleasant.  She looks well.  Her dressing is clean and dry.  Her strength is normal.  Disposition: Home  Discharge Instructions    Call MD for:  difficulty breathing, headache or visual disturbances   Complete by:  As directed    Call MD for:  extreme fatigue   Complete by:  As directed    Call MD for:  hives   Complete by:  As directed    Call MD for:  persistant dizziness or light-headedness   Complete by:  As directed    Call MD for:  persistant nausea and vomiting   Complete by:  As directed    Call MD for:  redness, tenderness, or signs of infection (pain, swelling, redness, odor or green/yellow discharge around incision site)   Complete by:  As directed    Call MD for:  severe uncontrolled pain   Complete by:  As directed    Call MD for:  temperature >100.4   Complete by:  As directed    Diet - low sodium heart healthy   Complete by:  As directed    Discharge instructions   Complete by:  As  directed    Call [phone] for a followup appointment. Take a stool softener while you are using pain medications.   Driving Restrictions   Complete by:  As directed    Do not drive for 2 weeks.   Increase activity slowly   Complete by:  As directed    Lifting restrictions   Complete by:  As directed    Do not lift more than 5 pounds. No excessive bending or twisting.   May shower / Bathe   Complete by:  As directed    He may shower after the pain she is removed 3 days after surgery. Leave the incision alone.   Remove dressing in 48 hours   Complete by:  As directed    Your stitches are under the scan and will dissolve by themselves. The Steri-Strips will fall off after you take a few showers. Do not rub back or pick at the wound, Leave the wound alone.     Allergies as of 12/18/2016      Reactions   Demerol [meperidine] Nausea And Vomiting, Other (See Comments)   "quit breathing" - 30 years ago   Altace [ramipril] Other (See Comments)   Cough       Medication List    STOP taking these medications   ciprofloxacin 500 MG tablet Commonly known as:  CIPRO   HYDROcodone-acetaminophen 7.5-325 MG tablet Commonly known as:  NORCO   metroNIDAZOLE 500 MG tablet Commonly known as:  FLAGYL  oxyCODONE-acetaminophen 5-325 MG tablet Commonly known as:  PERCOCET/ROXICET   zolpidem 5 MG tablet Commonly known as:  AMBIEN     TAKE these medications   albuterol (2.5 MG/3ML) 0.083% nebulizer solution Commonly known as:  PROVENTIL Take 2.5 mg by nebulization every 6 (six) hours as needed for wheezing.   aspirin EC 81 MG tablet Take 81 mg daily by mouth.   budesonide-formoterol 160-4.5 MCG/ACT inhaler Commonly known as:  SYMBICORT Inhale 2 puffs 2 (two) times daily into the lungs.   CALCIUM 600 + D 600-200 MG-UNIT Tabs Generic drug:  Calcium Carb-Cholecalciferol Take 1 tablet by mouth daily.   citalopram 20 MG tablet Commonly known as:  CELEXA Take 20 mg by mouth  daily.   cyclobenzaprine 10 MG tablet Commonly known as:  FLEXERIL Take 1 tablet (10 mg total) 3 (three) times daily as needed by mouth for muscle spasms.   docusate sodium 100 MG capsule Commonly known as:  COLACE Take 1 capsule (100 mg total) 2 (two) times daily by mouth.   esomeprazole 40 MG capsule Commonly known as:  NEXIUM Take 40 mg by mouth daily at 12 noon.   fish oil-omega-3 fatty acids 1000 MG capsule Take 2 g by mouth daily.   metoprolol tartrate 50 MG tablet Commonly known as:  LOPRESSOR Take 50 mg by mouth 2 (two) times daily.   Oxycodone HCl 10 MG Tabs Take 1 tablet (10 mg total) every 4 (four) hours as needed by mouth for severe pain ((score 7 to 10)).   ramipril 10 MG capsule Commonly known as:  ALTACE Take 10 mg by mouth 2 (two) times daily.   ranitidine 150 MG tablet Commonly known as:  ZANTAC Take 150 mg by mouth at bedtime.   rosuvastatin 10 MG tablet Commonly known as:  CRESTOR Take 10 mg by mouth daily.   verapamil 240 MG (CO) 24 hr tablet Commonly known as:  COVERA HS Take 240 mg by mouth 2 (two) times daily.   vitamin C 1000 MG tablet Take 1,000 mg by mouth daily.        Signed: Cristi LoronJeffrey D Markel Kurtenbach 12/18/2016, 7:58 AM

## 2016-12-19 MED FILL — Heparin Sodium (Porcine) Inj 1000 Unit/ML: INTRAMUSCULAR | Qty: 30 | Status: AC

## 2016-12-19 MED FILL — Sodium Chloride IV Soln 0.9%: INTRAVENOUS | Qty: 1000 | Status: AC

## 2017-11-28 DIAGNOSIS — Z01818 Encounter for other preprocedural examination: Secondary | ICD-10-CM

## 2020-11-21 ENCOUNTER — Encounter: Payer: Self-pay | Admitting: *Deleted

## 2020-11-21 ENCOUNTER — Encounter: Payer: Self-pay | Admitting: Cardiology

## 2020-11-21 DIAGNOSIS — J9611 Chronic respiratory failure with hypoxia: Secondary | ICD-10-CM | POA: Insufficient documentation

## 2020-11-21 DIAGNOSIS — J449 Chronic obstructive pulmonary disease, unspecified: Secondary | ICD-10-CM | POA: Insufficient documentation

## 2020-11-21 DIAGNOSIS — I2510 Atherosclerotic heart disease of native coronary artery without angina pectoris: Secondary | ICD-10-CM | POA: Insufficient documentation

## 2020-12-02 ENCOUNTER — Other Ambulatory Visit: Payer: Self-pay

## 2020-12-02 DIAGNOSIS — I10 Essential (primary) hypertension: Secondary | ICD-10-CM | POA: Insufficient documentation

## 2020-12-02 DIAGNOSIS — R112 Nausea with vomiting, unspecified: Secondary | ICD-10-CM | POA: Insufficient documentation

## 2020-12-02 DIAGNOSIS — M5416 Radiculopathy, lumbar region: Secondary | ICD-10-CM | POA: Insufficient documentation

## 2020-12-02 DIAGNOSIS — T7840XA Allergy, unspecified, initial encounter: Secondary | ICD-10-CM | POA: Insufficient documentation

## 2020-12-02 DIAGNOSIS — K219 Gastro-esophageal reflux disease without esophagitis: Secondary | ICD-10-CM | POA: Insufficient documentation

## 2020-12-02 DIAGNOSIS — J189 Pneumonia, unspecified organism: Secondary | ICD-10-CM | POA: Insufficient documentation

## 2020-12-02 DIAGNOSIS — R011 Cardiac murmur, unspecified: Secondary | ICD-10-CM | POA: Insufficient documentation

## 2020-12-02 DIAGNOSIS — K429 Umbilical hernia without obstruction or gangrene: Secondary | ICD-10-CM | POA: Insufficient documentation

## 2020-12-02 DIAGNOSIS — E559 Vitamin D deficiency, unspecified: Secondary | ICD-10-CM | POA: Insufficient documentation

## 2020-12-02 DIAGNOSIS — E669 Obesity, unspecified: Secondary | ICD-10-CM | POA: Insufficient documentation

## 2020-12-02 DIAGNOSIS — M810 Age-related osteoporosis without current pathological fracture: Secondary | ICD-10-CM | POA: Insufficient documentation

## 2020-12-02 DIAGNOSIS — M1611 Unilateral primary osteoarthritis, right hip: Secondary | ICD-10-CM | POA: Insufficient documentation

## 2020-12-02 DIAGNOSIS — S5291XA Unspecified fracture of right forearm, initial encounter for closed fracture: Secondary | ICD-10-CM | POA: Insufficient documentation

## 2020-12-02 DIAGNOSIS — G4700 Insomnia, unspecified: Secondary | ICD-10-CM | POA: Insufficient documentation

## 2020-12-02 DIAGNOSIS — G4730 Sleep apnea, unspecified: Secondary | ICD-10-CM | POA: Insufficient documentation

## 2020-12-02 DIAGNOSIS — F418 Other specified anxiety disorders: Secondary | ICD-10-CM | POA: Insufficient documentation

## 2020-12-02 DIAGNOSIS — J439 Emphysema, unspecified: Secondary | ICD-10-CM | POA: Insufficient documentation

## 2020-12-02 DIAGNOSIS — K922 Gastrointestinal hemorrhage, unspecified: Secondary | ICD-10-CM | POA: Insufficient documentation

## 2020-12-02 DIAGNOSIS — E785 Hyperlipidemia, unspecified: Secondary | ICD-10-CM | POA: Insufficient documentation

## 2020-12-05 NOTE — Progress Notes (Signed)
Cardiology Office Note:    Date:  12/06/2020   ID:  [name], DOB [birth year], MRN [id number]  PCP:  Leonides Sake, MD  Cardiologist:  Shirlee More, MD   Referring MD: Cyndi Bender, PA-C  ASSESSMENT:    1. Coronary artery disease of native artery of native heart with stable angina pectoris ([place])   2. Mixed hyperlipidemia   3. Hypertension, essential, benign    PLAN:    In order of problems listed above:  She has had a change in her anginal pattern symptoms of accelerated but not yet unstable she will continue medical therapy including aspirin calcium channel blocker beta-blocker and oral nitrates lipid-lowering referred to cardiac CTA I expect to show flow-limiting stenosis leading to coronary angiography and revascularization.  I offered a direct referral to coronary angiography and she deferred.  We will access that report with her coronary angiogram from years ago Continue current lipid-lowering treatment will reassess next visit and may require coincident Zetia or PCSK9 inhibitor Stable continue current medical therapy she is on a thiazide diuretic calcium channel blockers beta-blocker  Next appointment 4 weeks   Medication Adjustments/Labs and Tests Ordered: Current medicines are reviewed at length with the patient today.  Concerns regarding medicines are outlined above.  No orders of the defined types were placed in this encounter.  No orders of the defined types were placed in this encounter.    Chief complaint I have known CAD and having more frequent angina  History of Present Illness:    [name] is a 65 y.o. female who is being seen today for the evaluation of coronary artery disease at the request of Cyndi Bender, PA-C. She was seen [facility] ED chest pain 10/06/2020. CBC normal hemoglobin of 18.0 hematocrit 54.0 platelets 316,000 creatinine 1.54 GFR 37 cc stage III CKD chest x-ray had no active cardiopulmonary disease her  high-sensitivity troponin was low EKG was described as sinus rhythm possible left atrial enlargement otherwise normal BNP level was quite low at 38.  Patient left the emergency room without completing her evaluation.   She has a history of a coronary angiogram [facility] 2016 with 50% stenosis mild CAD Until 6 months ago she had a stable pattern of angina perhaps once a month relieved with rest and nitroglycerin Her symptoms have progressed she was an Insurance account manager to [place] and had a severe episode of chest pain shortness of breath and nausea relieved with nitroglycerin. She went to [facility] to visit a family member and walking and she had very severe typical angina substernal pressure rating to the jaw severe diaphoretic short of breath finally relieved after 3 nitroglycerin and then went to the emergency room. She also has COPD short of breath usually more than usual activities No edema orthopnea palpitation or syncope Changing her anginal pattern we discussed further evaluation coronary angiography versus cardiac CTA and she prefers to start with CTA she will continue her medical treatment and I am going to add oral nitrates until we get the report I told her I expect that she will need for revascularization Past Medical History:  Diagnosis Date   Allergy    sinus problems   Bilateral radial fractures    Dr. Adin Hector is managing   CAD (coronary artery disease)    Chronic respiratory failure with hypoxia ([place])    Colonic ischemia ([place]) 03/21/2013   COPD (chronic obstructive pulmonary disease) ([place])    Depression with anxiety  Emphysema of lung ([facility])    GERD (gastroesophageal reflux disease)    GI bleeding    Heart murmur    Dr. Bettina Gavia   Hyperlipidemia    Hypertension, essential, benign    Insomnia    Lumbar radicular pain    Obesity    Osteoarthritis of right hip    Osteoporosis    Pneumonia    hx of pneumonia after surgery   PONV  (postoperative nausea and vomiting)    Sleep apnea    CPAP   Spondylolisthesis of lumbar region [id number]   Umbilical hernia    Vitamin D deficiency     Past Surgical History:  Procedure Laterality Date   ABDOMINAL HYSTERECTOMY     BACK SURGERY     c-3 and c-4   CHOLECYSTECTOMY     coloctomy     5' of intesting removed   COLONOSCOPY     ESOPHAGOGASTRODUODENOSCOPY      Current Medications: Current Meds  Medication Sig   albuterol (PROVENTIL) (2.5 MG/3ML) 0.083% nebulizer solution Take 2.5 mg by nebulization every 6 (six) hours as needed for wheezing.   albuterol (VENTOLIN HFA) 108 (90 Base) MCG/ACT inhaler Inhale 2 puffs into the lungs daily as needed for wheezing or shortness of breath.   Albuterol Sulfate (PROAIR RESPICLICK) 123XX123 (90 Base) MCG/ACT AEPB Inhale 2 puffs into the lungs 4 (four) times daily as needed for shortness of breath.   budesonide-formoterol (SYMBICORT) 160-4.5 MCG/ACT inhaler Inhale 2 puffs 2 (two) times daily into the lungs.   citalopram (CELEXA) 40 MG tablet Take 40 mg by mouth daily.   cloNIDine (CATAPRES) 0.1 MG tablet Take 0.1 mg by mouth daily as needed for high blood pressure. 160/100   denosumab (PROLIA) 60 MG/ML SOSY injection Inject 60 mg into the skin every 6 (six) months.   diltiazem (CARDIZEM) 120 MG tablet Take 120 mg by mouth 2 (two) times daily.   docusate sodium (COLACE) 100 MG capsule Take 1 capsule (100 mg total) 2 (two) times daily by mouth.   famotidine (PEPCID) 40 MG tablet Take 40 mg by mouth daily.   gabapentin (NEURONTIN) 100 MG capsule Take 100 mg by mouth 2 (two) times daily.   hydrochlorothiazide (HYDRODIURIL) 25 MG tablet Take 25 mg by mouth daily.   HYDROcodone-acetaminophen (NORCO) 7.5-325 MG tablet Take 1-2 tablets by mouth 4 (four) times daily as needed for moderate pain.   metoprolol (LOPRESSOR) 50 MG tablet Take 50 mg by mouth 2 (two) times daily.   nitroGLYCERIN (NITROSTAT) 0.4 MG SL tablet Place 0.4 mg under the tongue  every 5 (five) minutes as needed for chest pain.   rosuvastatin (CRESTOR) 10 MG tablet Take 10 mg by mouth daily.   Vitamin D, Ergocalciferol, (DRISDOL) 1.25 MG (50000 UNIT) CAPS capsule Take 50,000 Units by mouth every 7 (seven) days.   zolpidem (AMBIEN) 5 MG tablet Take 5 mg by mouth at bedtime as needed for sleep.     Allergies:   Demerol [meperidine] and Altace [ramipril]   Social History   Socioeconomic History   Marital status: Married    Spouse name: Not on file   Number of children: Not on file   Years of education: Not on file   Highest education level: Not on file  Occupational History   Not on file  Tobacco Use   Smoking status: Former    Types: Cigarettes    Quit date: 07/2018    Years since quitting: 2.3   Smokeless tobacco: Never  Vaping Use   Vaping Use: Former  Substance and Sexual Activity   Alcohol use: No   Drug use: No   Sexual activity: Not on file  Other Topics Concern   Not on file  Social History Narrative   Not on file   Social Determinants of Health   Financial Resource Strain: Not on file  Food Insecurity: Not on file  Transportation Needs: Not on file  Physical Activity: Not on file  Stress: Not on file  Social Connections: Not on file     Family History: The patient's family history includes Colon cancer in her paternal grandfather; Diabetes in her father; Hypertension in her brother and father; Lupus in her mother; Stroke in her paternal grandfather.  ROS:   ROS Please see the history of present illness.     All other systems reviewed and are negative.  EKGs/Labs/Other Studies Reviewed:    The following studies were reviewed today:   EKG:  EKG is  ordered today.  The ekg ordered today is personally reviewed and demonstrates sinus rhythm nonspecific T waves  Recent Labs: 06/09/2020: Cholesterol 201 LDL 130 triglycerides 145 HDL 45 creatinine 0.82 potassium 4.1  Physical Exam:    VS:  BP (!) 144/82   Pulse 65   Ht 5\' 2"   (1.575 m)   Wt 190 lb (86.2 kg)   SpO2 94%   BMI 34.75 kg/m     Wt Readings from Last 3 Encounters:  12/06/20 190 lb (86.2 kg)  10/21/20 192 lb (87.1 kg)  12/17/16 192 lb (87.1 kg)     GEN: She appears her age well nourished, well developed in no acute distress HEENT: Normal NECK: No JVD; No carotid bruits LYMPHATICS: No lymphadenopathy CARDIAC: RRR, no murmurs, rubs, gallops RESPIRATORY:  Clear to auscultation without rales, wheezing or rhonchi  ABDOMEN: Soft, non-tender, non-distended MUSCULOSKELETAL:  No edema; No deformity  SKIN: Warm and dry NEUROLOGIC:  Alert and oriented x 3 PSYCHIATRIC:  Normal affect     Signed, 12/19/16, MD  12/06/2020 10:01 AM    [facility]

## 2020-12-06 ENCOUNTER — Encounter: Payer: Self-pay | Admitting: Cardiology

## 2020-12-06 ENCOUNTER — Other Ambulatory Visit: Payer: Self-pay

## 2020-12-06 ENCOUNTER — Ambulatory Visit (INDEPENDENT_AMBULATORY_CARE_PROVIDER_SITE_OTHER): Payer: Medicare Other | Admitting: Cardiology

## 2020-12-06 VITALS — BP 144/82 | HR 65 | Ht 62.0 in | Wt 190.0 lb

## 2020-12-06 DIAGNOSIS — I208 Other forms of angina pectoris: Secondary | ICD-10-CM

## 2020-12-06 DIAGNOSIS — I10 Essential (primary) hypertension: Secondary | ICD-10-CM | POA: Diagnosis not present

## 2020-12-06 DIAGNOSIS — R072 Precordial pain: Secondary | ICD-10-CM

## 2020-12-06 DIAGNOSIS — I25118 Atherosclerotic heart disease of native coronary artery with other forms of angina pectoris: Secondary | ICD-10-CM

## 2020-12-06 DIAGNOSIS — E782 Mixed hyperlipidemia: Secondary | ICD-10-CM

## 2020-12-06 DIAGNOSIS — R079 Chest pain, unspecified: Secondary | ICD-10-CM

## 2020-12-06 DIAGNOSIS — R931 Abnormal findings on diagnostic imaging of heart and coronary circulation: Secondary | ICD-10-CM

## 2020-12-06 NOTE — Patient Instructions (Signed)
Medication Instructions:  Your physician recommends that you continue on your current medications as directed. Please refer to the Current Medication list given to you today.  *If you need a refill on your cardiac medications before your next appointment, please call your pharmacy*   Lab Work: Your physician recommends that you return for lab work in: Within one week of your cardiac CT  BMP  If you have labs (blood work) drawn today and your tests are completely normal, you will receive your results only by: MyChart Message (if you have MyChart) OR A paper copy in the mail If you have any lab test that is abnormal or we need to change your treatment, we will call you to review the results.   Testing/Procedures:   Your cardiac CT will be scheduled at the below location:   [facility] [address] [place], [place] [address] [phone]  If scheduled at [facility], please arrive at the [facility] main entrance (entrance A) of [facility] 30 minutes prior to test start time. You can use the FREE valet parking offered at the main entrance (encouraged to control the heart rate for the test) Proceed to the [facility] Radiology Department (first floor) to check-in and test prep.  Please follow these instructions carefully (unless otherwise directed):  On the Night Before the Test: Be sure to Drink plenty of water. Do not consume any caffeinated/decaffeinated beverages or chocolate 12 hours prior to your test. Do not take any antihistamines 12 hours prior to your test.  On the Day of the Test: Drink plenty of water until 1 hour prior to the test. Do not eat any food 4 hours prior to the test. You may take your regular medications prior to the test.  Take metoprolol (Lopressor) two hours prior to test. HOLD Hydrochlorothiazide morning of the test. FEMALES- please wear underwire-free bra if available, avoid dresses & tight clothing       After the  Test: Drink plenty of water. After receiving IV contrast, you may experience a mild flushed feeling. This is normal. On occasion, you may experience a mild rash up to 24 hours after the test. This is not dangerous. If this occurs, you can take Benadryl 25 mg and increase your fluid intake. If you experience trouble breathing, this can be serious. If it is severe call 911 IMMEDIATELY. If it is mild, please call our office. If you take any of these medications: Glipizide/Metformin, Avandament, Glucavance, please do not take 48 hours after completing test unless otherwise instructed.  Please allow 2-4 weeks for scheduling of routine cardiac CTs. Some insurance companies require a pre-authorization which may delay scheduling of this test.   For non-scheduling related questions, please contact the cardiac imaging nurse navigator should you have any questions/concerns: Rockwell Alexandria, Cardiac Imaging Nurse Navigator Larey Brick, Cardiac Imaging Nurse Navigator [facility] Direct Office Dial: [phone]   For scheduling needs, including cancellations and rescheduling, please call [place], [phone].    Follow-Up: At [facility], you and your health needs are our priority.  As part of our continuing mission to provide you with exceptional heart care, we have created designated Provider Care Teams.  These Care Teams include your primary Cardiologist (physician) and Advanced Practice Providers (APPs -  Physician Assistants and Nurse Practitioners) who all work together to provide you with the care you need, when you need it.  We recommend signing up for the patient portal called "MyChart".  Sign up information is provided on this After Visit Summary.  MyChart is used to connect with patients for Virtual Visits (Telemedicine).  Patients are able to view lab/test results, encounter notes, upcoming appointments, etc.  Non-urgent messages can be sent to your provider as  well.   To learn more about what you can do with MyChart, go to [email or web address].    Your next appointment:   4 week(s)  The format for your next appointment:   In Person  Provider:   Norman Herrlich, MD    Other Instructions

## 2020-12-15 ENCOUNTER — Telehealth (HOSPITAL_COMMUNITY): Payer: Self-pay | Admitting: Emergency Medicine

## 2020-12-15 NOTE — Telephone Encounter (Signed)
Attempted to call patient regarding upcoming cardiac CT appointment. Left message on voicemail with name and callback number Augustina Braddock RN Navigator Cardiac Imaging [facility] [phone] Office [phone] Cell  Reminded to get labs 

## 2020-12-19 ENCOUNTER — Ambulatory Visit (HOSPITAL_COMMUNITY): Admission: RE | Admit: 2020-12-19 | Payer: Medicare Other | Source: Ambulatory Visit

## 2020-12-29 ENCOUNTER — Telehealth (HOSPITAL_COMMUNITY): Payer: Self-pay | Admitting: *Deleted

## 2020-12-29 ENCOUNTER — Other Ambulatory Visit (HOSPITAL_COMMUNITY): Payer: Self-pay | Admitting: *Deleted

## 2020-12-29 DIAGNOSIS — R079 Chest pain, unspecified: Secondary | ICD-10-CM

## 2020-12-29 DIAGNOSIS — I25118 Atherosclerotic heart disease of native coronary artery with other forms of angina pectoris: Secondary | ICD-10-CM

## 2020-12-29 NOTE — Telephone Encounter (Signed)
Reaching out to patient to offer assistance regarding upcoming cardiac imaging study; pt verbalizes understanding of appt date/time, parking situation and where to check in, pre-test NPO status and medications ordered, and verified current allergies; name and call back number provided for further questions should they arise  Larey Brick RN Navigator Cardiac Imaging [facility] [phone] office [phone] cell  Patient to take her daily medications and is aware to arrive at 3:30pm for her 4pm scan. She states she will obtain her blood work today.

## 2020-12-30 ENCOUNTER — Telehealth: Payer: Self-pay

## 2020-12-30 LAB — BASIC METABOLIC PANEL
BUN/Creatinine Ratio: 14 (ref 12–28)
BUN: 13 mg/dL (ref 8–27)
CO2: 21 mmol/L (ref 20–29)
Calcium: 9.4 mg/dL (ref 8.7–10.3)
Chloride: 106 mmol/L (ref 96–106)
Creatinine, Ser: 0.94 mg/dL (ref 0.57–1.00)
Glucose: 100 mg/dL — ABNORMAL HIGH (ref 70–99)
Potassium: 4.2 mmol/L (ref 3.5–5.2)
Sodium: 143 mmol/L (ref 134–144)
eGFR: 67 mL/min/{1.73_m2} (ref >59)

## 2020-12-30 NOTE — Telephone Encounter (Signed)
Patient notified of results.

## 2021-01-02 ENCOUNTER — Ambulatory Visit (HOSPITAL_COMMUNITY)
Admission: RE | Admit: 2021-01-02 | Discharge: 2021-01-02 | Disposition: A | Discharge status: Home / Self Care | Payer: Medicare Other | Source: Ambulatory Visit | Attending: Cardiology | Admitting: Cardiology

## 2021-01-02 ENCOUNTER — Other Ambulatory Visit: Payer: Self-pay

## 2021-01-02 DIAGNOSIS — I25118 Atherosclerotic heart disease of native coronary artery with other forms of angina pectoris: Secondary | ICD-10-CM | POA: Diagnosis present

## 2021-01-02 DIAGNOSIS — R079 Chest pain, unspecified: Secondary | ICD-10-CM | POA: Insufficient documentation

## 2021-01-02 DIAGNOSIS — I2510 Atherosclerotic heart disease of native coronary artery without angina pectoris: Secondary | ICD-10-CM | POA: Diagnosis not present

## 2021-01-02 MED ORDER — IOHEXOL 350 MG/ML SOLN
95.0000 mL | Freq: Once | INTRAVENOUS | Status: AC | PRN
  Administered 2021-01-02: 95 mL via INTRAVENOUS

## 2021-01-02 MED ORDER — NITROGLYCERIN 0.4 MG SL SUBL: 0.8000 mg | SUBLINGUAL_TABLET | SUBLINGUAL | Status: DC | PRN

## 2021-01-02 MED ORDER — NITROGLYCERIN 0.4 MG SL SUBL
SUBLINGUAL_TABLET | SUBLINGUAL | Status: AC
  Administered 2021-01-02: 0.8 mg via SUBLINGUAL
  Filled 2021-01-02: qty 2

## 2021-01-03 ENCOUNTER — Ambulatory Visit (HOSPITAL_COMMUNITY)
Admission: RE | Admit: 2021-01-03 | Discharge: 2021-01-03 | Disposition: A | Discharge status: Home / Self Care | Payer: Medicare Other | Source: Ambulatory Visit | Attending: Cardiology | Admitting: Cardiology

## 2021-01-03 ENCOUNTER — Other Ambulatory Visit (HOSPITAL_COMMUNITY): Payer: Self-pay | Admitting: *Deleted

## 2021-01-03 ENCOUNTER — Telehealth: Payer: Self-pay

## 2021-01-03 DIAGNOSIS — R931 Abnormal findings on diagnostic imaging of heart and coronary circulation: Secondary | ICD-10-CM | POA: Diagnosis present

## 2021-01-03 DIAGNOSIS — I2510 Atherosclerotic heart disease of native coronary artery without angina pectoris: Secondary | ICD-10-CM | POA: Diagnosis not present

## 2021-01-03 NOTE — Telephone Encounter (Signed)
Spoke with patient regarding results and recommendation.  Patient verbalizes understanding and is agreeable to plan of care. Advised patient to call back with any issues or concerns.  

## 2021-01-03 NOTE — Addendum Note (Signed)
Addended by: Gypsy Balsam on: 01/03/2021 02:04 PM   Modules accepted: Orders

## 2021-01-03 NOTE — Telephone Encounter (Signed)
-----   Message from Baldo Daub, MD sent at 01/03/2021  4:49 PM EST ----- Her FFR is normal at this time I would not advise a heart catheterization  Please keep her appointment with me and we can discuss additional medications if needed if she continues to have exertional angina

## 2021-01-03 NOTE — Telephone Encounter (Signed)
-----   Message from Baldo Daub, MD sent at 01/03/2021  1:00 PM EST ----- CTA shows moderate stenosis in the left anterior descending coronary artery, it was sent out for FFR if that is abnormal then I would advise her to have coronary angiography but is going to take a few more days.

## 2021-01-05 NOTE — Progress Notes (Signed)
Cardiology Office Note:    Date:  01/06/2021   ID:  [name], DOB [birth year], MRN [id number]  PCP:  Jamie Sake, MD  Cardiologist:  Jamie More, MD    Referring MD: Jamie Sake, MD    ASSESSMENT:    1. Agatston coronary artery calcium score greater than 400   2. Coronary artery disease of native artery of native heart with stable angina pectoris ([place])   3. Hypertension, essential, benign   4. Mixed hyperlipidemia    PLAN:    In order of problems listed above:  She has a very high calcium score LDL on statin is 130 add Zetia recheck lipids in 2 months and screen for LP(a) and she may require PCSK9 inhibitor if she does not achieve target She has moderate nonflow limiting stenosis in the LAD optimize therapy adding oral nitrate to calcium channel blocker beta-blocker Optimize treatment switching Cardizem to amlodipine She will need Ativan combined treatment see above   Next appointment: 3 months   Medication Adjustments/Labs and Tests Ordered: Current medicines are reviewed at length with the patient today.  Concerns regarding medicines are outlined above.  Orders Placed This Encounter  Procedures   Comprehensive metabolic panel   Lipid panel   Lipoprotein A (LPA)   Meds ordered this encounter  Medications   amLODipine (NORVASC) 5 MG tablet    Sig: Take 1 tablet (5 mg total) by mouth daily.    Dispense:  180 tablet    Refill:  3   ezetimibe (ZETIA) 10 MG tablet    Sig: Take 1 tablet (10 mg total) by mouth daily.    Dispense:  90 each    Refill:  3    Chief complaint follow-up after cardiac CTA History of Present Illness:    [name] is a 65 y.o. female with a hx of CAD hypertension and hyperlipidemia last seen 12/06/2020.She has a history of a coronary angiogram [facility] 2016 with 50% stenosis mild CAD.She was seen [facility] ED chest pain 10/06/2020. CBC normal hemoglobin of 18.0 hematocrit 54.0  platelets 316,000 creatinine 1.54 GFR 37 cc stage III CKD chest x-ray had no active cardiopulmonary disease her high-sensitivity troponin was low EKG was described as sinus rhythm possible left atrial enlargement otherwise normal BNP level was quite low at 38. Patient left the emergency room without completing her evaluation.   Compliance with diet, lifestyle and medications: Yes  Since her cardiac CTA she has had vague dizziness no palpitation no syncope no vertigo.  Home blood pressure runs from 120-130/80 to 160/100.  She has had 1 episode of nonexertional typical angina.  No edema shortness of breath palpitation or syncope.  Her most recent lipid profile was performed taking a high intensity statin and her LDL was 130.  She clearly require combined therapy she will start Zetia 1 month we will check a lipid profile LP(a) and if her LDL is not less than 75 she will need a PCSK9 inhibitor.  To optimize treatment switch from Cardizem to amlodipine better antihypertensive agent abnormal nitrate.  Should continue aspirin.  Symptoms persist we will add Zetia as appropriate.  Cardiac CTA 01/03/2021: IMPRESSION: 1. Coronary calcium score of 688. This was 41 percentile for age and sex matched control. 2. Normal coronary origin with right dominance. 3. CAD-RADS 3. Moderate stenosis (mid LAD). Consider symptom-guided anti-ischemic pharmacotherapy as well as risk factor modification per guideline directed care. Additional analysis with CT FFR will be  submitted. 4.FFR :  1. LM: 99 2. LAD: Prox 0.97, mid 0.92, distal 0.86 3. LCX: Prox 0.98, mid 0.97, distal 0.96 4. RCA: Prox 0.99, mid 0.98, distal 0.98    Past Medical History:  Diagnosis Date   Allergy    sinus problems   Bilateral radial fractures    Dr. Adin Watkins is managing   CAD (coronary artery disease)    Chronic respiratory failure with hypoxia ([place])    Colonic ischemia ([place]) 03/21/2013   COPD (chronic obstructive pulmonary disease) ([place])     Depression with anxiety    Emphysema of lung (HCC)    GERD (gastroesophageal reflux disease)    GI bleeding    Heart murmur    Dr. Bettina Watkins   Hyperlipidemia    Hypertension, essential, benign    Insomnia    Lumbar radicular pain    Obesity    Osteoarthritis of right hip    Osteoporosis    Pneumonia    hx of pneumonia after surgery   PONV (postoperative nausea and vomiting)    Sleep apnea    CPAP   Spondylolisthesis of lumbar region [id number]   Umbilical hernia    Vitamin D deficiency     Past Surgical History:  Procedure Laterality Date   ABDOMINAL HYSTERECTOMY     BACK SURGERY     c-3 and c-4   CHOLECYSTECTOMY     coloctomy     5' of intesting removed   COLONOSCOPY     ESOPHAGOGASTRODUODENOSCOPY      Current Medications: Current Meds  Medication Sig   albuterol (PROVENTIL) (2.5 MG/3ML) 0.083% nebulizer solution Take 2.5 mg by nebulization every 6 (six) hours as needed for wheezing.   albuterol (VENTOLIN HFA) 108 (90 Base) MCG/ACT inhaler Inhale 2 puffs into the lungs daily as needed for wheezing or shortness of breath.   Albuterol Sulfate (PROAIR RESPICLICK) 123XX123 (90 Base) MCG/ACT AEPB Inhale 2 puffs into the lungs 4 (four) times daily as needed for shortness of breath.   amLODipine (NORVASC) 5 MG tablet Take 1 tablet (5 mg total) by mouth daily.   aspirin 81 MG chewable tablet Chew 81 mg by mouth daily.   budesonide-formoterol (SYMBICORT) 160-4.5 MCG/ACT inhaler Inhale 2 puffs 2 (two) times daily into the lungs.   citalopram (CELEXA) 40 MG tablet Take 40 mg by mouth daily.   cloNIDine (CATAPRES) 0.1 MG tablet Take 0.1 mg by mouth daily as needed for high blood pressure. 160/100   denosumab (PROLIA) 60 MG/ML SOSY injection Inject 60 mg into the skin every 6 (six) months.   docusate sodium (COLACE) 100 MG capsule Take 1 capsule (100 mg total) 2 (two) times daily by mouth.   ezetimibe (ZETIA) 10 MG tablet Take 1 tablet (10 mg total) by mouth daily.   famotidine  (PEPCID) 40 MG tablet Take 40 mg by mouth daily.   gabapentin (NEURONTIN) 100 MG capsule Take 100 mg by mouth 2 (two) times daily.   hydrochlorothiazide (HYDRODIURIL) 25 MG tablet Take 25 mg by mouth daily.   HYDROcodone-acetaminophen (NORCO) 7.5-325 MG tablet Take 1-2 tablets by mouth 4 (four) times daily as needed for moderate pain.   metoprolol (LOPRESSOR) 50 MG tablet Take 50 mg by mouth 2 (two) times daily.   nitroGLYCERIN (NITROSTAT) 0.4 MG SL tablet Place 0.4 mg under the tongue every 5 (five) minutes as needed for chest pain.   rosuvastatin (CRESTOR) 10 MG tablet Take 10 mg by mouth daily.   Vitamin D, Ergocalciferol, (DRISDOL) 1.25 MG (  50000 UNIT) CAPS capsule Take 50,000 Units by mouth every 7 (seven) days.   zolpidem (AMBIEN) 5 MG tablet Take 5 mg by mouth at bedtime as needed for sleep.   [DISCONTINUED] diltiazem (CARDIZEM) 120 MG tablet Take 120 mg by mouth 2 (two) times daily.     Allergies:   Demerol [meperidine] and Altace [ramipril]   Social History   Socioeconomic History   Marital status: Married    Spouse name: Not on file   Number of children: Not on file   Years of education: Not on file   Highest education level: Not on file  Occupational History   Not on file  Tobacco Use   Smoking status: Former    Types: Cigarettes    Quit date: 07/2018    Years since quitting: 2.4    Passive exposure: Past   Smokeless tobacco: Never  Vaping Use   Vaping Use: Former  Substance and Sexual Activity   Alcohol use: No   Drug use: No   Sexual activity: Not on file  Other Topics Concern   Not on file  Social History Narrative   Not on file   Social Determinants of Health   Financial Resource Strain: Not on file  Food Insecurity: Not on file  Transportation Needs: Not on file  Physical Activity: Not on file  Stress: Not on file  Social Connections: Not on file     Family History: The patient's family history includes Colon cancer in her paternal grandfather;  Diabetes in her father; Hypertension in her brother and father; Lupus in her mother; Stroke in her paternal grandfather. ROS:   Please see the history of present illness.    All other systems reviewed and are negative.  EKGs/Labs/Other Studies Reviewed:    The following studies were reviewed today:   Recent Labs: 12/29/2020: BUN 13; Creatinine, Ser 0.94; Potassium 4.2; Sodium 143  Recent Lipid Panel No results found for: CHOL, TRIG, HDL, CHOLHDL, VLDL, LDLCALC, LDLDIRECT  Physical Exam:    VS:  BP (!) 144/60   Pulse 60   Ht 5\' 2"  (1.575 m)   Wt 192 lb 12.8 oz (87.5 kg)   SpO2 95%   BMI 35.26 kg/m     Wt Readings from Last 3 Encounters:  01/06/21 192 lb 12.8 oz (87.5 kg)  12/06/20 190 lb (86.2 kg)  10/21/20 192 lb (87.1 kg)     GEN:  Well nourished, well developed in no acute distress HEENT: Normal NECK: No JVD; No carotid bruits LYMPHATICS: No lymphadenopathy CARDIAC: RRR, no murmurs, rubs, gallops RESPIRATORY:  Clear to auscultation without rales, wheezing or rhonchi  ABDOMEN: Soft, non-tender, non-distended MUSCULOSKELETAL:  No edema; No deformity  SKIN: Warm and dry NEUROLOGIC:  Alert and oriented x 3 PSYCHIATRIC:  Normal affect    Signed, 10/23/20, MD  01/06/2021 2:05 PM    [facility]

## 2021-01-06 ENCOUNTER — Encounter: Payer: Self-pay | Admitting: Cardiology

## 2021-01-06 ENCOUNTER — Ambulatory Visit: Payer: Medicare Other | Admitting: Cardiology

## 2021-01-06 ENCOUNTER — Ambulatory Visit (INDEPENDENT_AMBULATORY_CARE_PROVIDER_SITE_OTHER): Payer: Medicare Other | Admitting: Cardiology

## 2021-01-06 ENCOUNTER — Other Ambulatory Visit: Payer: Self-pay

## 2021-01-06 VITALS — BP 144/60 | HR 60 | Ht 62.0 in | Wt 192.8 lb

## 2021-01-06 DIAGNOSIS — R931 Abnormal findings on diagnostic imaging of heart and coronary circulation: Secondary | ICD-10-CM | POA: Diagnosis not present

## 2021-01-06 DIAGNOSIS — I10 Essential (primary) hypertension: Secondary | ICD-10-CM

## 2021-01-06 DIAGNOSIS — E782 Mixed hyperlipidemia: Secondary | ICD-10-CM | POA: Diagnosis not present

## 2021-01-06 DIAGNOSIS — I25118 Atherosclerotic heart disease of native coronary artery with other forms of angina pectoris: Secondary | ICD-10-CM

## 2021-01-06 MED ORDER — EZETIMIBE 10 MG PO TABS: 10.0000 mg | ORAL_TABLET | Freq: Every day | ORAL | 3 refills | Status: DC

## 2021-01-06 MED ORDER — AMLODIPINE BESYLATE 5 MG PO TABS: 5.0000 mg | ORAL_TABLET | Freq: Every day | ORAL | 3 refills | Status: DC

## 2021-01-06 NOTE — Patient Instructions (Signed)
Medication Instructions:  Your physician has recommended you make the following change in your medication:  STOP: Diltiazem  START: Amlodipine 5 mg take one tablet by mouth daily.  START: Zetia 10 mg take one tablet by mouth daily.  *If you need a refill on your cardiac medications before your next appointment, please call your pharmacy*   Lab Work: Your physician recommends that you return for lab work in: 1 MONTH CMP, Lipids, Lpa If you have labs (blood work) drawn today and your tests are completely normal, you will receive your results only by: MyChart Message (if you have MyChart) OR A paper copy in the mail If you have any lab test that is abnormal or we need to change your treatment, we will call you to review the results.   Testing/Procedures: None   Follow-Up: At [facility], you and your health needs are our priority.  As part of our continuing mission to provide you with exceptional heart care, we have created designated Provider Care Teams.  These Care Teams include your primary Cardiologist (physician) and Advanced Practice Providers (APPs -  Physician Assistants and Nurse Practitioners) who all work together to provide you with the care you need, when you need it.  We recommend signing up for the patient portal called "MyChart".  Sign up information is provided on this After Visit Summary.  MyChart is used to connect with patients for Virtual Visits (Telemedicine).  Patients are able to view lab/test results, encounter notes, upcoming appointments, etc.  Non-urgent messages can be sent to your provider as well.   To learn more about what you can do with MyChart, go to [email or web address].    Your next appointment:   3 month(s)  The format for your next appointment:   In Person  Provider:   Norman Herrlich, MD    Other Instructions

## 2021-01-11 ENCOUNTER — Telehealth: Payer: Self-pay | Admitting: Cardiology

## 2021-01-11 MED ORDER — EZETIMIBE 10 MG PO TABS: 10.0000 mg | ORAL_TABLET | Freq: Every day | ORAL | 3 refills | Status: DC

## 2021-01-11 MED ORDER — NITROGLYCERIN 0.4 MG SL SUBL: 0.4000 mg | SUBLINGUAL_TABLET | SUBLINGUAL | 0 refills | Status: AC | PRN

## 2021-01-11 NOTE — Addendum Note (Signed)
Addended by: Delorse Limber I on: 01/11/2021 04:19 PM   Modules accepted: Orders

## 2021-01-11 NOTE — Telephone Encounter (Signed)
Refill sent in per request.  

## 2021-01-11 NOTE — Telephone Encounter (Signed)
°*  STAT* If patient is at the pharmacy, call can be transferred to refill team.   1. Which medications need to be refilled? (please list name of each medication and dose if known) ezetimibe (ZETIA) 10 MG tablet  nitroGLYCERIN (NITROSTAT) 0.4 MG SL tablet  2. Which pharmacy/location (including street and city if local pharmacy) is medication to be sent to? [id number] - [place], [place] - [address] [facility]  3. Do they need a 30 day or 90 day supply? 90

## 2021-01-11 NOTE — Telephone Encounter (Signed)
Left message on patients voicemail to please return our call.   

## 2021-01-11 NOTE — Telephone Encounter (Signed)
Please see below.

## 2021-01-11 NOTE — Telephone Encounter (Signed)
Pt c/o medication issue:  1. Name of Medication: amLODipine (NORVASC) 5 MG tablet  2. How are you currently taking this medication (dosage and times per day)? Take 1 tablet (5 mg total) by mouth daily.  3. Are you having a reaction (difficulty breathing--STAT)? No   4. What is your medication issue? Patient states that her bp is still high wants to know if dr wants her to keep taking it.

## 2021-01-12 NOTE — Telephone Encounter (Signed)
Left message on patients voicemail to please return our call.   

## 2021-01-13 MED ORDER — ISOSORBIDE MONONITRATE ER 30 MG PO TB24: 30.0000 mg | ORAL_TABLET | Freq: Every day | ORAL | 3 refills | Status: DC

## 2021-01-13 NOTE — Telephone Encounter (Signed)
Spoke to the patient just now and let her know Dr. Munley's recommendations. She verbalizes understanding and thanks me for the call back.  

## 2021-01-13 NOTE — Telephone Encounter (Signed)
Spoke to the patient just now. She lets me know that her blood pressure has been running high even after starting the amlodipine. She took it for one week and then decided to stop it and started back on her cardizem 120 mg BID. She was not advised to do this by anyone and just decided this on her own.    She started her amlodipine on Friday night. Her blood pressure the following days is as listed below:  Saturday- 160/102 Sunday- 148/98 Monday- 162/102 Restarted Cardizem on Tuesday and has not checked her blood pressure since then. She took her medications this morning at 7 am. Her blood pressure currently is 156/98 and heart rate is 64 but she states that her cardizem will kick in and lower it later on.   She also states that Dr. Dulce Sellar was going to give her a long acting "Nitroglycerin" to take daily but it was not prescribed. I am assuming she is talking about isosorbide but I will route to Dr. Dulce Sellar for further recommendations from here.

## 2021-01-13 NOTE — Addendum Note (Signed)
Addended by: Delorse Limber I on: 01/13/2021 08:35 AM   Modules accepted: Orders

## 2021-02-09 ENCOUNTER — Ambulatory Visit: Payer: Medicare Other | Admitting: Cardiology

## 2021-04-06 ENCOUNTER — Ambulatory Visit (INDEPENDENT_AMBULATORY_CARE_PROVIDER_SITE_OTHER): Payer: Medicare Other | Admitting: Cardiology

## 2021-04-06 ENCOUNTER — Encounter: Payer: Self-pay | Admitting: Cardiology

## 2021-04-06 ENCOUNTER — Other Ambulatory Visit: Payer: Self-pay

## 2021-04-06 VITALS — BP 162/100 | HR 65 | Ht 62.0 in | Wt 195.0 lb

## 2021-04-06 DIAGNOSIS — R931 Abnormal findings on diagnostic imaging of heart and coronary circulation: Secondary | ICD-10-CM

## 2021-04-06 DIAGNOSIS — I25118 Atherosclerotic heart disease of native coronary artery with other forms of angina pectoris: Secondary | ICD-10-CM | POA: Diagnosis not present

## 2021-04-06 DIAGNOSIS — I10 Essential (primary) hypertension: Secondary | ICD-10-CM

## 2021-04-06 DIAGNOSIS — E782 Mixed hyperlipidemia: Secondary | ICD-10-CM

## 2021-04-06 MED ORDER — FUROSEMIDE 20 MG PO TABS: 20.0000 mg | ORAL_TABLET | Freq: Every day | ORAL | 3 refills | Status: DC

## 2021-04-06 MED ORDER — CARVEDILOL 6.25 MG PO TABS: 6.2500 mg | ORAL_TABLET | Freq: Two times a day (BID) | ORAL | 3 refills | Status: DC

## 2021-04-06 MED ORDER — VALSARTAN 80 MG PO TABS: 80.0000 mg | ORAL_TABLET | Freq: Every day | ORAL | 5 refills | Status: DC

## 2021-04-06 NOTE — Progress Notes (Signed)
Cardiology Office Note:    Date:  04/06/2021   ID:  [name], DOB [birth year], MRN [id number]  PCP:  Ailene RavelHamrick, Maura L, MD  Cardiologist:  Norman HerrlichBrian Thelma Viana, MD    Referring MD: Ailene RavelHamrick, Maura L, MD    ASSESSMENT:    1. Hypertension, essential, benign   2. Coronary artery disease of native artery of native heart with stable angina pectoris (HCC)   3. Agatston coronary artery calcium score greater than 400   4. Mixed hyperlipidemia    PLAN:    In order of problems listed above:  This is a complex situation her blood pressure is now quite out-of-control no clear-cut etiology and I am going to give her a low-dose of a loop diuretic transition metoprolol to carvedilol much more potent antihypertensive and start her on ARB she was intolerant of ACE inhibitors with cough.  She is also taking clonidine twice daily.  She will trend her blood pressures and send a list to me Friday through MyChart Concerned about CAD becoming less stable check high-sensitivity troponin was elevated I will send her to the hospital for admission as acute coronary syndrome Continue current lipid-lowering and statin also screen for LP(a) ordered but not done last visit   Next appointment: 4 weeks   Medication Adjustments/Labs and Tests Ordered: Current medicines are reviewed at length with the patient today.  Concerns regarding medicines are outlined above.  No orders of the defined types were placed in this encounter.  No orders of the defined types were placed in this encounter.   Chief Complaint  Patient presents with   Follow-up   Coronary Artery Disease    History of Present Illness:    Jamie BookbinderKaren Lynn Watkins is a 66 y.o. female with a hx of CAD hypertension with stage III CKD hyper lipidemia last seen 01/06/2021.  She had a coronary calcium score 01/03/2021 with a very high calcium score 688/96 percentile with moderate stenosis of the mid left anterior descending coronary artery with normal FFR  treated medically.  Compliance with diet, lifestyle and medications: Yes  She is not doing well, for about 2 weeks her blood pressure has been severely elevated numbers of 1 60-1 70 as high as 1 10-1 20 she has had some back discomfort and she has had 4 episodes of typical angina relieved with nitroglycerin.  During this time she saw her PCP she tells me that her hemoglobin and hematocrit are elevated her father and history of polycythemia vera and she is being referred to hematology.  No edema shortness of breath orthopnea Past Medical History:  Diagnosis Date   Allergy    sinus problems   Bilateral radial fractures    Dr. Mardene SpeakHubler is managing   CAD (coronary artery disease)    Chronic respiratory failure with hypoxia (HCC)    Colonic ischemia (HCC) 03/21/2013   COPD (chronic obstructive pulmonary disease) (HCC)    Depression with anxiety    Emphysema of lung (HCC)    GERD (gastroesophageal reflux disease)    GI bleeding    Heart murmur    Dr. Dulce SellarMunley   Hyperlipidemia    Hypertension, essential, benign    Insomnia    Lumbar radicular pain    Obesity    Osteoarthritis of right hip    Osteoporosis    Pneumonia    hx of pneumonia after surgery   PONV (postoperative nausea and vomiting)    Sleep apnea    CPAP   Spondylolisthesis of lumbar region  12/17/2016   Umbilical hernia    Vitamin D deficiency     Past Surgical History:  Procedure Laterality Date   ABDOMINAL HYSTERECTOMY     BACK SURGERY     c-3 and c-4   CHOLECYSTECTOMY     coloctomy     5' of intesting removed   COLONOSCOPY     ESOPHAGOGASTRODUODENOSCOPY      Current Medications: Current Meds  Medication Sig   albuterol (PROVENTIL) (2.5 MG/3ML) 0.083% nebulizer solution Take 2.5 mg by nebulization every 6 (six) hours as needed for wheezing.   albuterol (VENTOLIN HFA) 108 (90 Base) MCG/ACT inhaler Inhale 2 puffs into the lungs daily as needed for wheezing or shortness of breath.   Albuterol Sulfate (PROAIR  RESPICLICK) 108 (90 Base) MCG/ACT AEPB Inhale 2 puffs into the lungs 4 (four) times daily as needed for shortness of breath.   aspirin EC 81 MG tablet Take 81 mg by mouth daily. Swallow whole.   budesonide-formoterol (SYMBICORT) 160-4.5 MCG/ACT inhaler Inhale 2 puffs 2 (two) times daily into the lungs.   citalopram (CELEXA) 40 MG tablet Take 40 mg by mouth daily.   cloNIDine (CATAPRES) 0.1 MG tablet Take 0.1 mg by mouth 2 (two) times daily. 160/100   denosumab (PROLIA) 60 MG/ML SOSY injection Inject 60 mg into the skin every 6 (six) months.   diltiazem (CARDIZEM) 120 MG tablet Take 120 mg by mouth 2 (two) times daily.   docusate sodium (COLACE) 100 MG capsule Take 1 capsule (100 mg total) 2 (two) times daily by mouth.   ezetimibe (ZETIA) 10 MG tablet Take 1 tablet (10 mg total) by mouth daily.   famotidine (PEPCID) 40 MG tablet Take 40 mg by mouth daily.   gabapentin (NEURONTIN) 100 MG capsule Take 100 mg by mouth 2 (two) times daily.   hydrochlorothiazide (HYDRODIURIL) 25 MG tablet Take 25 mg by mouth daily.   HYDROcodone-acetaminophen (NORCO) 7.5-325 MG tablet Take 1-2 tablets by mouth 4 (four) times daily as needed for moderate pain.   isosorbide mononitrate (IMDUR) 30 MG 24 hr tablet Take 1 tablet (30 mg total) by mouth daily.   metoprolol (LOPRESSOR) 50 MG tablet Take 50 mg by mouth 2 (two) times daily.   nitroGLYCERIN (NITROSTAT) 0.4 MG SL tablet Place 1 tablet (0.4 mg total) under the tongue every 5 (five) minutes as needed for chest pain.   rosuvastatin (CRESTOR) 10 MG tablet Take 10 mg by mouth daily.   Vitamin D, Ergocalciferol, (DRISDOL) 1.25 MG (50000 UNIT) CAPS capsule Take 50,000 Units by mouth every 7 (seven) days.   zolpidem (AMBIEN) 5 MG tablet Take 5 mg by mouth at bedtime as needed for sleep.   [DISCONTINUED] aspirin 81 MG chewable tablet Chew 81 mg by mouth daily.     Allergies:   Demerol [meperidine] and Altace [ramipril]   Social History   Socioeconomic History    Marital status: Married    Spouse name: Not on file   Number of children: Not on file   Years of education: Not on file   Highest education level: Not on file  Occupational History   Not on file  Tobacco Use   Smoking status: Former    Types: Cigarettes    Quit date: 07/2018    Years since quitting: 2.6    Passive exposure: Past   Smokeless tobacco: Never  Vaping Use   Vaping Use: Former  Substance and Sexual Activity   Alcohol use: No   Drug use: No  Sexual activity: Not on file  Other Topics Concern   Not on file  Social History Narrative   Not on file   Social Determinants of Health   Financial Resource Strain: Not on file  Food Insecurity: Not on file  Transportation Needs: Not on file  Physical Activity: Not on file  Stress: Not on file  Social Connections: Not on file     Family History: The patient's family history includes Colon cancer in her paternal grandfather; Diabetes in her father; Hypertension in her brother and father; Lupus in her mother; Stroke in her paternal grandfather. ROS:   Please see the history of present illness.    All other systems reviewed and are negative.  EKGs/Labs/Other Studies Reviewed:    The following studies were reviewed today: Cardiac CTA 01/03/2021: IMPRESSION: 1. Coronary calcium score of 688. This was 25 percentile for age and sex matched control. 2. Normal coronary origin with right dominance. 3. CAD-RADS 3. Moderate stenosis (mid LAD). Consider symptom-guided anti-ischemic pharmacotherapy as well as risk factor modification per guideline directed care. Additional analysis with CT FFR will be submitted. 4.FFR :  1. LM: 99 2. LAD: Prox 0.97, mid 0.92, distal 0.86 3. LCX: Prox 0.98, mid 0.97, distal 0.96 4. RCA: Prox 0.99, mid 0.98, distal 0.98 EKG:  EKG ordered today and personally reviewed.  The ekg ordered today demonstrates sinus rhythm normal EKG  Recent Labs: 12/29/2020: BUN 13; Creatinine, Ser 0.94;  Potassium 4.2; Sodium 143  Recent Lipid Panel No results found for: CHOL, TRIG, HDL, CHOLHDL, VLDL, LDLCALC, LDLDIRECT  Physical Exam:    VS:  BP (!) 162/100 (BP Location: Right Arm)    Pulse 65    Ht 5\' 2"  (1.575 m)    Wt 195 lb (88.5 kg)    SpO2 97%    BMI 35.67 kg/m     Wt Readings from Last 3 Encounters:  04/06/21 195 lb (88.5 kg)  01/06/21 192 lb 12.8 oz (87.5 kg)  12/06/20 190 lb (86.2 kg)     GEN: He has a well nourished, well developed in no acute distress HEENT: Normal NECK: No JVD; No carotid bruits LYMPHATICS: No lymphadenopathy CARDIAC: RRR, no murmurs, rubs, gallops RESPIRATORY:  Clear to auscultation without rales, wheezing or rhonchi  ABDOMEN: Soft, non-tender, non-distended MUSCULOSKELETAL:  No edema; No deformity  SKIN: Warm and dry NEUROLOGIC:  Alert and oriented x 3 PSYCHIATRIC:  Normal affect    Signed, 13/08/22, MD  04/06/2021 1:22 PM    [facility]

## 2021-04-06 NOTE — Patient Instructions (Signed)
Medication Instructions:  ?Your physician has recommended you make the following change in your medication:  ?Start Furosemide 20 mg once daily ?Start Carvedilol 6.25 two times daily ?Start Valsartan 80 mg once daily ?Discontinue Metoprolol ? ?*If you need a refill on your cardiac medications before your next appointment, please call your pharmacy* ? ? ?Lab Work: ?Your physician recommends that you return for lab work in: Today for Stat Troponin level and LPa ? ?If you have labs (blood work) drawn today and your tests are completely normal, you will receive your results only by: ?MyChart Message (if you have MyChart) OR ?A paper copy in the mail ?If you have any lab test that is abnormal or we need to change your treatment, we will call you to review the results. ? ? ?Testing/Procedures: ?NONE ? ? ?Follow-Up: ?At [facility], you and your health needs are our priority.  As part of our continuing mission to provide you with exceptional heart care, we have created designated Provider Care Teams.  These Care Teams include your primary Cardiologist (physician) and Advanced Practice Providers (APPs -  Physician Assistants and Nurse Practitioners) who all work together to provide you with the care you need, when you need it. ? ?We recommend signing up for the patient portal called "MyChart".  Sign up information is provided on this After Visit Summary.  MyChart is used to connect with patients for Virtual Visits (Telemedicine).  Patients are able to view lab/test results, encounter notes, upcoming appointments, etc.  Non-urgent messages can be sent to your provider as well.   ?To learn more about what you can do with MyChart, go to [email or web address].   ? ?Your next appointment:   ?4 week(s) ? ?The format for your next appointment:   ?In Person ? ?Provider:   ?Norman Herrlich, MD  ? ? ?Other Instructions ?Send Dr. Dulce Sellar BP readings on Friday by MyChart  ?

## 2021-04-07 ENCOUNTER — Encounter: Payer: Self-pay | Admitting: Cardiology

## 2021-04-07 ENCOUNTER — Telehealth: Payer: Self-pay

## 2021-04-07 LAB — LIPOPROTEIN A (LPA): Lipoprotein (a): 17.7 nmol/L (ref <75.0)

## 2021-04-07 LAB — TROPONIN T: Troponin T (Highly Sensitive): 9 ng/L (ref 0–14)

## 2021-04-07 NOTE — Telephone Encounter (Signed)
Patient notified of results.

## 2021-04-07 NOTE — Telephone Encounter (Signed)
-----   Message from Baldo Daub, MD sent at 04/07/2021  1:46 PM EST ----- ?Normal or stable result ?

## 2021-04-25 DIAGNOSIS — R339 Retention of urine, unspecified: Secondary | ICD-10-CM

## 2021-04-25 DIAGNOSIS — R300 Dysuria: Secondary | ICD-10-CM

## 2021-04-25 HISTORY — DX: Retention of urine, unspecified: R33.9

## 2021-04-25 HISTORY — DX: Dysuria: R30.0

## 2021-05-11 NOTE — Progress Notes (Signed)
?Cardiology Office Note:   ? ?Date:  05/12/2021  ? ?ID:  [name], DOB 05-Jan-1956, MRN [id number] ? ?PCP:  Ailene Ravel, MD  ?Cardiologist:  Norman Herrlich, MD   ? ?Referring MD: Ailene Ravel, MD  ? ? ?ASSESSMENT:   ? ?1. Hypertension, essential, benign   ?2. Coronary artery disease of native artery of native heart with stable angina pectoris (HCC)   ?3. Agatston coronary artery calcium score greater than 400   ?4. Mixed hyperlipidemia   ? ?PLAN:   ? ?In order of problems listed above: ? ?Hypertension is much better controlled continue current multidrug regimen self-management I told her if she is getting symptomatic hypotension ischemia message for MyChart ?Continue treatment for coronary artery disease including high intensity statin having no angina. ? ? ?Next appointment: 1 year ? ? ?Medication Adjustments/Labs and Tests Ordered: ?Current medicines are reviewed at length with the patient today.  Concerns regarding medicines are outlined above.  ?No orders of the defined types were placed in this encounter. ? ?No orders of the defined types were placed in this encounter. ? ? ?No chief complaint on file. ? ? ?History of Present Illness:   ? ?[name] is a 66 y.o. female with a hx of CAD hypertension stage III CKD hyperlipidemia at very high coronary calcium score 688/96 percentile and moderate stenosis mid left anterior descending coronary artery with normal left before on cardiac CTA 01/03/2021 last seen 04/06/2021 with poorly controlled hypertension. ? ?Compliance with diet, lifestyle and medications: Yes ? ?Blood pressure is much better controlled median numbers are in the range of 130/80 early morning before medications 1 50-100/110 and after medications as low as 100/60 but she has learned to stagger them and has no symptomatic hypotension ?No further chest pain edema shortness of breath ?She had a fall in [place] and a bruise on her forehead but did not lose consciousness and was seen  by physician at home afterwards ?Past Medical History:  ?Diagnosis Date  ? Allergy   ? sinus problems  ? Bilateral radial fractures   ? Dr. Mardene Speak is managing  ? CAD (coronary artery disease)   ? Chronic respiratory failure with hypoxia (HCC)   ? Colonic ischemia (HCC) 03/21/2013  ? COPD (chronic obstructive pulmonary disease) (HCC)   ? Depression with anxiety   ? Emphysema of lung (HCC)   ? GERD (gastroesophageal reflux disease)   ? GI bleeding   ? Heart murmur   ? Dr. Dulce Sellar  ? Hyperlipidemia   ? Hypertension, essential, benign   ? Insomnia   ? Lumbar radicular pain   ? Obesity   ? Osteoarthritis of right hip   ? Osteoporosis   ? Pneumonia   ? hx of pneumonia after surgery  ? PONV (postoperative nausea and vomiting)   ? Sleep apnea   ? CPAP  ? Spondylolisthesis of lumbar region 12/17/2016  ? Umbilical hernia   ? Vitamin D deficiency   ? ? ?Past Surgical History:  ?Procedure Laterality Date  ? ABDOMINAL HYSTERECTOMY    ? BACK SURGERY    ? c-3 and c-4  ? CHOLECYSTECTOMY    ? coloctomy    ? 5' of intesting removed  ? COLONOSCOPY    ? ESOPHAGOGASTRODUODENOSCOPY    ? ? ?Current Medications: ?Current Meds  ?Medication Sig  ? albuterol (PROVENTIL) (2.5 MG/3ML) 0.083% nebulizer solution Take 2.5 mg by nebulization every 6 (six) hours as needed for wheezing.  ? albuterol (VENTOLIN HFA) 108 (  90 Base) MCG/ACT inhaler Inhale 2 puffs into the lungs daily as needed for wheezing or shortness of breath.  ? Albuterol Sulfate (PROAIR RESPICLICK) 108 (90 Base) MCG/ACT AEPB Inhale 2 puffs into the lungs 4 (four) times daily as needed for shortness of breath.  ? aspirin EC 81 MG tablet Take 81 mg by mouth daily. Swallow whole.  ? budesonide-formoterol (SYMBICORT) 160-4.5 MCG/ACT inhaler Inhale 2 puffs 2 (two) times daily into the lungs.  ? carvedilol (COREG) 6.25 MG tablet Take 1 tablet (6.25 mg total) by mouth 2 (two) times daily.  ? citalopram (CELEXA) 40 MG tablet Take 40 mg by mouth daily.  ? cloNIDine (CATAPRES) 0.1 MG tablet  Take 0.1 mg by mouth 2 (two) times daily. 160/100  ? denosumab (PROLIA) 60 MG/ML SOSY injection Inject 60 mg into the skin every 6 (six) months.  ? diltiazem (CARDIZEM) 120 MG tablet Take 120 mg by mouth 2 (two) times daily.  ? docusate sodium (COLACE) 100 MG capsule Take 1 capsule (100 mg total) 2 (two) times daily by mouth.  ? ezetimibe (ZETIA) 10 MG tablet Take 1 tablet (10 mg total) by mouth daily.  ? famotidine (PEPCID) 40 MG tablet Take 40 mg by mouth daily.  ? furosemide (LASIX) 20 MG tablet Take 1 tablet (20 mg total) by mouth daily.  ? gabapentin (NEURONTIN) 100 MG capsule Take 100 mg by mouth 2 (two) times daily.  ? hydrochlorothiazide (HYDRODIURIL) 25 MG tablet Take 25 mg by mouth daily.  ? HYDROcodone-acetaminophen (NORCO) 7.5-325 MG tablet Take 1-2 tablets by mouth 4 (four) times daily as needed for moderate pain.  ? nitroGLYCERIN (NITROSTAT) 0.4 MG SL tablet Place 1 tablet (0.4 mg total) under the tongue every 5 (five) minutes as needed for chest pain.  ? rosuvastatin (CRESTOR) 10 MG tablet Take 10 mg by mouth daily.  ? valsartan (DIOVAN) 80 MG tablet Take 1 tablet (80 mg total) by mouth daily.  ? Vitamin D, Ergocalciferol, (DRISDOL) 1.25 MG (50000 UNIT) CAPS capsule Take 50,000 Units by mouth every 7 (seven) days.  ? zolpidem (AMBIEN) 5 MG tablet Take 5 mg by mouth at bedtime as needed for sleep.  ?  ? ?Allergies:   Demerol [meperidine] and Altace [ramipril]  ? ?Social History  ? ?Socioeconomic History  ? Marital status: Married  ?  Spouse name: Not on file  ? Number of children: Not on file  ? Years of education: Not on file  ? Highest education level: Not on file  ?Occupational History  ? Not on file  ?Tobacco Use  ? Smoking status: Former  ?  Types: Cigarettes  ?  Quit date: 07/2018  ?  Years since quitting: 2.7  ?  Passive exposure: Past  ? Smokeless tobacco: Never  ?Vaping Use  ? Vaping Use: Former  ?Substance and Sexual Activity  ? Alcohol use: No  ? Drug use: No  ? Sexual activity: Not on file   ?Other Topics Concern  ? Not on file  ?Social History Narrative  ? Not on file  ? ?Social Determinants of Health  ? ?Financial Resource Strain: Not on file  ?Food Insecurity: Not on file  ?Transportation Needs: Not on file  ?Physical Activity: Not on file  ?Stress: Not on file  ?Social Connections: Not on file  ?  ? ?Family History: ?The patient's family history includes Colon cancer in her paternal grandfather; Diabetes in her father; Hypertension in her brother and father; Lupus in her mother; Stroke in her paternal  grandfather. ?ROS:   ?Please see the history of present illness.    ?All other systems reviewed and are negative. ? ?EKGs/Labs/Other Studies Reviewed:   ? ?The following studies were reviewed today: ? ? ? ?Recent Labs: ?12/29/2020: BUN 13; Creatinine, Ser 0.94; Potassium 4.2; Sodium 143  ?Recent Lipid Panel ?No results found for: CHOL, TRIG, HDL, CHOLHDL, VLDL, LDLCALC, LDLDIRECT ? ?Physical Exam:   ? ?VS:  BP 120/76 (BP Location: Left Arm)   Pulse 64   Ht 5\' 2"  (1.575 m)   Wt 194 lb (88 kg)   SpO2 94%   BMI 35.48 kg/m?    ? ?Wt Readings from Last 3 Encounters:  ?05/12/21 194 lb (88 kg)  ?04/06/21 195 lb (88.5 kg)  ?01/06/21 192 lb 12.8 oz (87.5 kg)  ?  ? ?GEN:  Well nourished, well developed in no acute distress ?HEENT: Normal ?NECK: No JVD; No carotid bruits ?LYMPHATICS: No lymphadenopathy ?CARDIAC: RRR, no murmurs, rubs, gallops ?RESPIRATORY:  Clear to auscultation without rales, wheezing or rhonchi  ?ABDOMEN: Soft, non-tender, non-distended ?MUSCULOSKELETAL:  No edema; No deformity  ?SKIN: Warm and dry ?NEUROLOGIC:  Alert and oriented x 3 ?PSYCHIATRIC:  Normal affect  ? ? ?Signed, ?Norman HerrlichBrian Momina Hunton, MD  ?05/12/2021 11:01 AM    ?[facility]  ?

## 2021-05-12 ENCOUNTER — Ambulatory Visit (INDEPENDENT_AMBULATORY_CARE_PROVIDER_SITE_OTHER): Payer: Medicare Other | Admitting: Cardiology

## 2021-05-12 ENCOUNTER — Encounter: Payer: Self-pay | Admitting: Cardiology

## 2021-05-12 VITALS — BP 120/76 | HR 64 | Ht 62.0 in | Wt 194.0 lb

## 2021-05-12 DIAGNOSIS — E782 Mixed hyperlipidemia: Secondary | ICD-10-CM

## 2021-05-12 DIAGNOSIS — I10 Essential (primary) hypertension: Secondary | ICD-10-CM | POA: Diagnosis not present

## 2021-05-12 DIAGNOSIS — R931 Abnormal findings on diagnostic imaging of heart and coronary circulation: Secondary | ICD-10-CM

## 2021-05-12 DIAGNOSIS — I25118 Atherosclerotic heart disease of native coronary artery with other forms of angina pectoris: Secondary | ICD-10-CM

## 2021-05-12 NOTE — Patient Instructions (Signed)

## 2021-07-21 ENCOUNTER — Other Ambulatory Visit: Payer: Self-pay | Admitting: Student

## 2021-07-21 DIAGNOSIS — M542 Cervicalgia: Secondary | ICD-10-CM

## 2021-09-01 ENCOUNTER — Emergency Department (HOSPITAL_COMMUNITY): Payer: Medicare Other

## 2021-09-01 ENCOUNTER — Observation Stay (HOSPITAL_COMMUNITY)
Admission: EM | Admit: 2021-09-01 | Discharge: 2021-09-02 | Disposition: A | Discharge status: Home / Self Care | Payer: Medicare Other | Attending: Family Medicine | Admitting: Family Medicine

## 2021-09-01 ENCOUNTER — Other Ambulatory Visit: Payer: Self-pay

## 2021-09-01 DIAGNOSIS — I160 Hypertensive urgency: Secondary | ICD-10-CM | POA: Insufficient documentation

## 2021-09-01 DIAGNOSIS — Z7982 Long term (current) use of aspirin: Secondary | ICD-10-CM | POA: Diagnosis not present

## 2021-09-01 DIAGNOSIS — Z79899 Other long term (current) drug therapy: Secondary | ICD-10-CM | POA: Diagnosis not present

## 2021-09-01 DIAGNOSIS — R9431 Abnormal electrocardiogram [ECG] [EKG]: Secondary | ICD-10-CM

## 2021-09-01 DIAGNOSIS — R072 Precordial pain: Secondary | ICD-10-CM | POA: Diagnosis not present

## 2021-09-01 DIAGNOSIS — E785 Hyperlipidemia, unspecified: Secondary | ICD-10-CM | POA: Diagnosis present

## 2021-09-01 DIAGNOSIS — F418 Other specified anxiety disorders: Secondary | ICD-10-CM | POA: Diagnosis present

## 2021-09-01 DIAGNOSIS — I10 Essential (primary) hypertension: Secondary | ICD-10-CM | POA: Diagnosis present

## 2021-09-01 DIAGNOSIS — I2510 Atherosclerotic heart disease of native coronary artery without angina pectoris: Secondary | ICD-10-CM | POA: Diagnosis present

## 2021-09-01 DIAGNOSIS — Z87891 Personal history of nicotine dependence: Secondary | ICD-10-CM | POA: Diagnosis not present

## 2021-09-01 DIAGNOSIS — R079 Chest pain, unspecified: Secondary | ICD-10-CM | POA: Diagnosis present

## 2021-09-01 DIAGNOSIS — J449 Chronic obstructive pulmonary disease, unspecified: Secondary | ICD-10-CM | POA: Diagnosis not present

## 2021-09-01 DIAGNOSIS — G4730 Sleep apnea, unspecified: Secondary | ICD-10-CM | POA: Diagnosis present

## 2021-09-01 HISTORY — DX: Chest pain, unspecified: R07.9

## 2021-09-01 HISTORY — DX: Abnormal electrocardiogram (ECG) (EKG): R94.31

## 2021-09-01 LAB — CBC WITH DIFFERENTIAL/PLATELET
Abs Immature Granulocytes: 0.04 10*3/uL (ref 0.00–0.07)
Basophils Absolute: 0.1 10*3/uL (ref 0.0–0.1)
Basophils Relative: 1 %
Eosinophils Absolute: 0.2 10*3/uL (ref 0.0–0.5)
Eosinophils Relative: 1 %
HCT: 50.2 % — ABNORMAL HIGH (ref 36.0–46.0)
Hemoglobin: 17.4 g/dL — ABNORMAL HIGH (ref 12.0–15.0)
Immature Granulocytes: 0 %
Lymphocytes Relative: 27 %
Lymphs Abs: 3.4 10*3/uL (ref 0.7–4.0)
MCH: 32.3 pg (ref 26.0–34.0)
MCHC: 34.7 g/dL (ref 30.0–36.0)
MCV: 93.1 fL (ref 80.0–100.0)
Monocytes Absolute: 1.1 10*3/uL — ABNORMAL HIGH (ref 0.1–1.0)
Monocytes Relative: 8 %
Neutro Abs: 7.9 10*3/uL — ABNORMAL HIGH (ref 1.7–7.7)
Neutrophils Relative %: 63 %
Platelets: 323 10*3/uL (ref 150–400)
RBC: 5.39 MIL/uL — ABNORMAL HIGH (ref 3.87–5.11)
RDW: 12.8 % (ref 11.5–15.5)
WBC: 12.6 10*3/uL — ABNORMAL HIGH (ref 4.0–10.5)
nRBC: 0 % (ref 0.0–0.2)

## 2021-09-01 LAB — BASIC METABOLIC PANEL
Anion gap: 11 (ref 5–15)
BUN: 9 mg/dL (ref 8–23)
CO2: 23 mmol/L (ref 22–32)
Calcium: 9.1 mg/dL (ref 8.9–10.3)
Chloride: 109 mmol/L (ref 98–111)
Creatinine, Ser: 0.74 mg/dL (ref 0.44–1.00)
GFR, Estimated: >60 mL/min (ref >60)
Glucose, Bld: 108 mg/dL — ABNORMAL HIGH (ref 70–99)
Potassium: 3.5 mmol/L (ref 3.5–5.1)
Sodium: 143 mmol/L (ref 135–145)

## 2021-09-01 LAB — TROPONIN I (HIGH SENSITIVITY)
Troponin I (High Sensitivity): 5 ng/L (ref <18)
Troponin I (High Sensitivity): 6 ng/L (ref <18)

## 2021-09-01 MED ORDER — NITROGLYCERIN 0.4 MG SL SUBL
0.4000 mg | SUBLINGUAL_TABLET | Freq: Once | SUBLINGUAL | Status: AC
  Administered 2021-09-01: 0.4 mg via SUBLINGUAL
  Filled 2021-09-01: qty 1

## 2021-09-01 NOTE — ED Notes (Signed)
Called lab regarding adding on Mg to previous collected labs. Per lab, they will add on.

## 2021-09-01 NOTE — Assessment & Plan Note (Addendum)
EKG shows prolonged QTc >600.  May be misinterpretation due to T wave flattening. Patient monitored on telemetry with measured Qtc of 350 msec on day of discharge.

## 2021-09-01 NOTE — H&P (Signed)
History and Physical    [name] DDU:[id number] DOB: 1955/03/05 DOA: 09/01/2021  PCP: Ailene Ravel, MD  Patient coming from: Home  I have personally briefly reviewed patient's old medical records in [facility]  Chief Complaint: Chest pain  HPI: [name] is a 66 y.o. female with medical history significant for CAD, COPD, HTN, HLD, depression, OSA on CPAP who presented to the ED for evaluation of chest pain.  Patient reports intermittent midsternal chest pressure.  She notices this more often when her blood pressure is elevated.  She has associated headache.  She says her CPAP machine broke about 2 weeks ago and has noted that her blood pressure has been higher since then.  She had a CT coronary study which showed moderate stenosis of the mid LAD in December 2022.  She has not had previous stress testing or cardiac cath.  ED Course  Labs/Imaging on admission: I have personally reviewed following labs and imaging studies.  Initial vitals showed BP 147/90, pulse 80, RR 25, temperature not recorded, SPO2 96% on room air.  Labs show sodium 143, potassium 3.5, bicarb 23, BUN 9, creatinine 0.74, serum glucose 108, WBC 12.6, hemoglobin 17.4, platelets 323,000, troponin 5 > 6.  Portable chest x-ray negative for focal consolidation, edema, effusion.  Patient was given sublingual nitroglycerin.  EDP discussed with on-call cardiology who recommended medical admission for further chest pain work-up.  The hospitalist service was consulted to admit for further evaluation and management.  Review of Systems: All systems reviewed and are negative except as documented in history of present illness above.   Past Medical History:  Diagnosis Date   Allergy    sinus problems   Bilateral radial fractures    Dr. Mardene Speak is managing   CAD (coronary artery disease)    Chronic respiratory failure with hypoxia (HCC)    Colonic ischemia (HCC) 03/21/2013   COPD (chronic  obstructive pulmonary disease) (HCC)    Depression with anxiety    Emphysema of lung (HCC)    GERD (gastroesophageal reflux disease)    GI bleeding    Heart murmur    Dr. Dulce Sellar   Hyperlipidemia    Hypertension, essential, benign    Insomnia    Lumbar radicular pain    Obesity    Osteoarthritis of right hip    Osteoporosis    Pneumonia    hx of pneumonia after surgery   PONV (postoperative nausea and vomiting)    Sleep apnea    CPAP   Spondylolisthesis of lumbar region 12/17/2016   Umbilical hernia    Vitamin D deficiency     Past Surgical History:  Procedure Laterality Date   ABDOMINAL HYSTERECTOMY     BACK SURGERY     c-3 and c-4   CHOLECYSTECTOMY     coloctomy     5' of intesting removed   COLONOSCOPY     ESOPHAGOGASTRODUODENOSCOPY      Social History:  reports that she quit smoking about 3 years ago. Her smoking use included cigarettes. She has been exposed to tobacco smoke. She has never used smokeless tobacco. She reports that she does not drink alcohol and does not use drugs.  Allergies  Allergen Reactions   Demerol [Meperidine] Nausea And Vomiting and Other (See Comments)    "quit breathing" - 30 years ago   Altace [Ramipril] Other (See Comments)    Cough      Family History  Problem Relation Age of Onset  Lupus Mother    Diabetes Father    Hypertension Father    Hypertension Brother    Colon cancer Paternal Grandfather    Stroke Paternal Grandfather      Prior to Admission medications   Medication Sig Start Date End Date Taking? Authorizing Provider  albuterol (PROVENTIL) (2.5 MG/3ML) 0.083% nebulizer solution Take 2.5 mg by nebulization every 6 (six) hours as needed for wheezing.    [provider]  albuterol (VENTOLIN HFA) 108 (90 Base) MCG/ACT inhaler Inhale 2 puffs into the lungs daily as needed for wheezing or shortness of breath.    [provider]  Albuterol Sulfate (PROAIR RESPICLICK) 108 (90 Base) MCG/ACT AEPB Inhale  2 puffs into the lungs 4 (four) times daily as needed for shortness of breath.    [provider]  aspirin EC 81 MG tablet Take 81 mg by mouth daily. Swallow whole.    [provider]  budesonide-formoterol (SYMBICORT) 160-4.5 MCG/ACT inhaler Inhale 2 puffs 2 (two) times daily into the lungs.    [provider]  carvedilol (COREG) 6.25 MG tablet Take 1 tablet (6.25 mg total) by mouth 2 (two) times daily. 04/06/21   Baldo Daub, MD  citalopram (CELEXA) 40 MG tablet Take 40 mg by mouth daily.    [provider]  cloNIDine (CATAPRES) 0.1 MG tablet Take 0.1 mg by mouth 2 (two) times daily. 160/100    [provider]  denosumab (PROLIA) 60 MG/ML SOSY injection Inject 60 mg into the skin every 6 (six) months.    [provider]  diltiazem (CARDIZEM) 120 MG tablet Take 120 mg by mouth 2 (two) times daily.    [provider]  docusate sodium (COLACE) 100 MG capsule Take 1 capsule (100 mg total) 2 (two) times daily by mouth. 12/18/16   Tressie Stalker, MD  ezetimibe (ZETIA) 10 MG tablet Take 1 tablet (10 mg total) by mouth daily. 01/11/21 05/12/21  Baldo Daub, MD  famotidine (PEPCID) 40 MG tablet Take 40 mg by mouth daily.    [provider]  furosemide (LASIX) 20 MG tablet Take 1 tablet (20 mg total) by mouth daily. 04/06/21 07/05/21  Baldo Daub, MD  gabapentin (NEURONTIN) 100 MG capsule Take 100 mg by mouth 2 (two) times daily. 11/09/20   [provider]  hydrochlorothiazide (HYDRODIURIL) 25 MG tablet Take 25 mg by mouth daily.    [provider]  HYDROcodone-acetaminophen (NORCO) 7.5-325 MG tablet Take 1-2 tablets by mouth 4 (four) times daily as needed for moderate pain.    [provider]  isosorbide mononitrate (IMDUR) 30 MG 24 hr tablet Take 1 tablet (30 mg total) by mouth daily. 01/13/21 04/13/21  Baldo Daub, MD  nitroGLYCERIN (NITROSTAT) 0.4 MG SL tablet Place 1 tablet (0.4 mg total) under  the tongue every 5 (five) minutes as needed for chest pain. 01/11/21   Baldo Daub, MD  rosuvastatin (CRESTOR) 10 MG tablet Take 10 mg by mouth daily.    [provider]  valsartan (DIOVAN) 80 MG tablet Take 1 tablet (80 mg total) by mouth daily. 04/06/21   Baldo Daub, MD  Vitamin D, Ergocalciferol, (DRISDOL) 1.25 MG (50000 UNIT) CAPS capsule Take 50,000 Units by mouth every 7 (seven) days.    [provider]  zolpidem (AMBIEN) 5 MG tablet Take 5 mg by mouth at bedtime as needed for sleep.    [provider]    Physical Exam: Vitals:   09/01/21 2330  09/02/21 0015 09/02/21 0026 09/02/21 0055  BP: (!) 164/87 (!) 168/102  (!) 148/108  Pulse: 74 70    Resp: (!) 24 (!) 25  (!) 25  Temp:   97.9 F (36.6 C) 98.1 F (36.7 C)  TempSrc:   Oral Oral  SpO2: 94% 93%    Weight:    85.4 kg  Height:    5\' 4"  (1.626 m)   Constitutional: Resting in bed, NAD, calm, comfortable Eyes: EOMI, lids and conjunctivae normal ENMT: Mucous membranes are moist. Posterior pharynx clear of any exudate or lesions.Normal dentition.  Neck: normal, supple, no masses. Respiratory: clear to auscultation bilaterally, no wheezing, no crackles. Normal respiratory effort. No accessory muscle use.  Cardiovascular: Regular rate and rhythm, no murmurs / rubs / gallops. No extremity edema. 2+ pedal pulses. Abdomen: no tenderness, no masses palpated. No hepatosplenomegaly. Bowel sounds positive.  Musculoskeletal: no clubbing / cyanosis. No joint deformity upper and lower extremities. Good ROM, no contractures. Normal muscle tone.  Skin: no rashes, lesions, ulcers. No induration Neurologic: Sensation intact. Strength 5/5 in all 4.  Psychiatric: Alert and oriented x 3. Normal mood.   EKG: Personally reviewed. Sinus rhythm, rate 67, T wave flattening lateral leads, questionable prolonged QTc versus misinterpretation due to flat T waves.  T wave changes new when compared to  prior.  Assessment/Plan Principal Problem:   Chest pain Active Problems:   CAD (coronary artery disease)   Hypertension, essential, benign   Prolonged QT interval   COPD (chronic obstructive pulmonary disease) (HCC)   Depression with anxiety   Hyperlipidemia   Sleep apnea   [name] is a 66 y.o. female with medical history significant for CAD, COPD, HTN, HLD, depression, OSA on CPAP who is admitted for chest pain evaluation.  Assessment and Plan: * Chest pain CAD moderate to mid LAD on CT coronary Intermittent chest pressure likely related to hypertensive episodes.  Troponin negative x2.  Does have lateral T wave changes on EKG. -Cardiology to see -Continue aspirin, Coreg, statin  Prolonged QT interval EKG shows prolonged QTc >600.  May be misinterpretation due to T wave flattening.  Will keep on telemetry, check magnesium and TSH.  Hypertension, essential, benign BP elevated likely due to not having CPAP last 2 weeks.  Reports adherence to home meds. -Continue Coreg, clonidine, diltiazem, HCTZ, valsartan  Sleep apnea Patient states her CPAP machine broke 2 weeks ago.  Continue CPAP in hospital.  Hyperlipidemia Continue rosuvastatin.  Depression with anxiety Hold Celexa  COPD (chronic obstructive pulmonary disease) (HCC) Stable, continue Symbicort and albuterol as needed.  DVT prophylaxis: enoxaparin (LOVENOX) injection 40 mg Start: 09/02/21 1000 Code Status: Full code, confirmed with patient on admission Family Communication: Discussed with patient, she has discussed with family Disposition Plan: From home and likely discharge to home pending clinical progress Consults called: Cardiology Severity of Illness: The appropriate patient status for this patient is OBSERVATION. Observation status is judged to be reasonable and necessary in order to provide the required intensity of service to ensure the patient's safety. The patient's presenting symptoms, physical  exam findings, and initial radiographic and laboratory data in the context of their medical condition is felt to place them at decreased risk for further clinical deterioration. Furthermore, it is anticipated that the patient will be medically stable for discharge from the hospital within 2 midnights of admission.   11/02/21 MD [facility]  If 7PM-7AM, please contact night-coverage [email or web address]  09/02/2021, 1:22 AM

## 2021-09-01 NOTE — Assessment & Plan Note (Signed)
CAD.

## 2021-09-01 NOTE — ED Triage Notes (Signed)
PT BIB EMS complaining of on and off CP with an onset of 6am. Pt reports being clammy and nauseated. Pain resolved after pt took her blood pressure medication. Pt then proceeded to go to an appointment with the pain clinic where they took an EKG and advised her to go to the hospital for her CP. Patient went back home instead and had her pain come back. Pt then proceeded to call EMS. Pt does state she has been keeping up with her meds but notes that her CPAP broke 3 weeks ago and has been sleeping without it.   Pt is hypertensive 160/100 with EMS.

## 2021-09-01 NOTE — ED Provider Notes (Signed)
[facility] EMERGENCY DEPARTMENT Provider Note   CSN: [id number] Arrival date & time: 09/01/21  1926     History  Chief Complaint  Patient presents with   Chest Pain   Hypertension    [name] is a 66 y.o. female.  Patient here with chest pain since this morning.  On and off.  Has had couple episodes of chest pain in the last month.  Not sure if it is exertional or not or what makes it worse or better.  History of CAD, hypertension.  Denies any shortness of breath.  No recent surgery or travel.  Denies any fever, chills, cough, sputum production.  No weakness or numbness.  The history is provided by the patient.       Home Medications Prior to Admission medications   Medication Sig Start Date End Date Taking? Authorizing Provider  albuterol (PROVENTIL) (2.5 MG/3ML) 0.083% nebulizer solution Take 2.5 mg by nebulization every 6 (six) hours as needed for wheezing.    [provider]  albuterol (VENTOLIN HFA) 108 (90 Base) MCG/ACT inhaler Inhale 2 puffs into the lungs daily as needed for wheezing or shortness of breath.    [provider]  Albuterol Sulfate (PROAIR RESPICLICK) 108 (90 Base) MCG/ACT AEPB Inhale 2 puffs into the lungs 4 (four) times daily as needed for shortness of breath.    [provider]  aspirin EC 81 MG tablet Take 81 mg by mouth daily. Swallow whole.    [provider]  budesonide-formoterol (SYMBICORT) 160-4.5 MCG/ACT inhaler Inhale 2 puffs 2 (two) times daily into the lungs.    [provider]  carvedilol (COREG) 6.25 MG tablet Take 1 tablet (6.25 mg total) by mouth 2 (two) times daily. 04/06/21   Baldo Daub, MD  citalopram (CELEXA) 40 MG tablet Take 40 mg by mouth daily.    [provider]  cloNIDine (CATAPRES) 0.1 MG tablet Take 0.1 mg by mouth 2 (two) times daily. 160/100    [provider]  denosumab (PROLIA) 60 MG/ML SOSY injection Inject 60 mg into the skin every  6 (six) months.    [provider]  diltiazem (CARDIZEM) 120 MG tablet Take 120 mg by mouth 2 (two) times daily.    [provider]  docusate sodium (COLACE) 100 MG capsule Take 1 capsule (100 mg total) 2 (two) times daily by mouth. 12/18/16   Tressie Stalker, MD  ezetimibe (ZETIA) 10 MG tablet Take 1 tablet (10 mg total) by mouth daily. 01/11/21 05/12/21  Baldo Daub, MD  famotidine (PEPCID) 40 MG tablet Take 40 mg by mouth daily.    [provider]  furosemide (LASIX) 20 MG tablet Take 1 tablet (20 mg total) by mouth daily. 04/06/21 07/05/21  Baldo Daub, MD  gabapentin (NEURONTIN) 100 MG capsule Take 100 mg by mouth 2 (two) times daily. 11/09/20   [provider]  hydrochlorothiazide (HYDRODIURIL) 25 MG tablet Take 25 mg by mouth daily.    [provider]  HYDROcodone-acetaminophen (NORCO) 7.5-325 MG tablet Take 1-2 tablets by mouth 4 (four) times daily as needed for moderate pain.    [provider]  isosorbide mononitrate (IMDUR) 30 MG 24 hr tablet Take 1 tablet (30 mg total) by mouth daily. 01/13/21 04/13/21  Baldo Daub, MD  nitroGLYCERIN (NITROSTAT) 0.4 MG SL tablet Place 1 tablet (0.4 mg total) under the tongue every 5 (five) minutes as needed for chest pain. 01/11/21   Baldo Daub,  MD  rosuvastatin (CRESTOR) 10 MG tablet Take 10 mg by mouth daily.    [provider]  valsartan (DIOVAN) 80 MG tablet Take 1 tablet (80 mg total) by mouth daily. 04/06/21   Baldo Daub, MD  Vitamin D, Ergocalciferol, (DRISDOL) 1.25 MG (50000 UNIT) CAPS capsule Take 50,000 Units by mouth every 7 (seven) days.    [provider]  zolpidem (AMBIEN) 5 MG tablet Take 5 mg by mouth at bedtime as needed for sleep.    [provider]      Allergies    Demerol [meperidine] and Altace [ramipril]    Review of Systems   Review of Systems  Physical Exam Updated Vital Signs BP (!) 157/99   Pulse 69   Resp 19   Ht 5\' 2"   (1.575 m)   Wt 88 kg   SpO2 95%   BMI 35.48 kg/m  Physical Exam Vitals and nursing note reviewed.  Constitutional:      General: She is not in acute distress.    Appearance: She is well-developed. She is not ill-appearing.  HENT:     Head: Normocephalic and atraumatic.  Eyes:     Extraocular Movements: Extraocular movements intact.     Conjunctiva/sclera: Conjunctivae normal.     Pupils: Pupils are equal, round, and reactive to light.  Cardiovascular:     Rate and Rhythm: Normal rate and regular rhythm.     Pulses:          Radial pulses are 2+ on the right side and 2+ on the left side.     Heart sounds: Normal heart sounds. No murmur heard. Pulmonary:     Effort: Pulmonary effort is normal. No respiratory distress.     Breath sounds: Normal breath sounds.  Abdominal:     Palpations: Abdomen is soft.     Tenderness: There is no abdominal tenderness.  Musculoskeletal:        General: No swelling. Normal range of motion.     Cervical back: Normal range of motion and neck supple.     Right lower leg: No edema.     Left lower leg: No edema.  Skin:    General: Skin is warm and dry.     Capillary Refill: Capillary refill takes less than 2 seconds.  Neurological:     Mental Status: She is alert.  Psychiatric:        Mood and Affect: Mood normal.     ED Results / Procedures / Treatments   Labs (all labs ordered are listed, but only abnormal results are displayed) Labs Reviewed  CBC WITH DIFFERENTIAL/PLATELET - Abnormal; Notable for the following components:      Result Value   WBC 12.6 (*)    RBC 5.39 (*)    Hemoglobin 17.4 (*)    HCT 50.2 (*)    Neutro Abs 7.9 (*)    Monocytes Absolute 1.1 (*)    All other components within normal limits  BASIC METABOLIC PANEL - Abnormal; Notable for the following components:   Glucose, Bld 108 (*)    All other components within normal limits  TROPONIN I (HIGH SENSITIVITY)  TROPONIN I (HIGH SENSITIVITY)    EKG EKG  Interpretation  Date/Time:  Friday September 01 2021 21:51:50 EDT Ventricular Rate:  67 PR Interval:  159 QRS Duration: 110 QT Interval:  607 QTC Calculation: 641 R Axis:   93 Text Interpretation: Sinus rhythm Probable lateral infarct, age indeterminate Prolonged QT interval Confirmed by 04-04-1971,  Kennard Fildes [phone]) on 09/01/2021 10:04:05 PM  Radiology DG Chest Portable 1 View  Result Date: 09/01/2021 CLINICAL DATA:  Elevated blood pressure and chest pain EXAM: PORTABLE CHEST 1 VIEW COMPARISON:  Radiographs 03/21/2013 FINDINGS: Bibasilar scarring/atelectasis. No pleural effusion or pneumothorax. Stable cardiomediastinal silhouette. Aortic calcification. Cervical spine fusion hardware. Degenerative arthritis both shoulders. No acute osseous abnormality. IMPRESSION: No active disease. Electronically Signed   By: Minerva Fester M.D.   On: 09/01/2021 19:47    Procedures Procedures    Medications Ordered in ED Medications  nitroGLYCERIN (NITROSTAT) SL tablet 0.4 mg (0.4 mg Sublingual Given 09/01/21 1952)    ED Course/ Medical Decision Making/ A&P                           Medical Decision Making Amount and/or Complexity of Data Reviewed Labs: ordered. Radiology: ordered.  Risk Prescription drug management. Decision regarding hospitalization.   [name] is here with chest pain.  Patient mildly hypertensive upon arrival.  Otherwise normal vitals.  Chest pain since this morning.  Has had some episodes of chest pain over the past month.  History of CAD and hypertension.  States that she had a blockage in the past.  But they are medically treating it.  Denies any infectious symptoms.  Differential diagnosis is ACS versus less likely pneumonia.  She is concerned about her blood pressure being high as well.  Could be hypertensive process as well.  Neurologically she is intact.  She has no PE risk factors.  We will get CBC, BMP, troponin, chest x-ray, EKG.  Per my review and interpretation of  EKG there appeared to be some ST changes with T wave inversions in the lateral leads compared to prior.  Per my review and interpretation of labs, troponin within normal limits.  No significant anemia, electrolyte abnormality, kidney injury, leukocytosis.  Chest x-ray without any evidence of pneumonia or pneumothorax.  I have contacted Dr. Hector Brunswick with cardiology, he will come down to the ED to evaluate the patient.  Second troponin is pending.  Repeat EKG with no changes.  Second troponin within normal limits.  Cardiology recommends medicine to admission for further optimization of blood pressure.  Can proceed to echocardiogram to further rule out ACS.  Patient to be admitted to the medicine.  She has not been very compliant with her medications and hopefully getting her back on a consistent regimen can help control her discomfort better.  Blood pressure has improved given nitroglycerin here in the ED.  This chart was dictated using voice recognition software.  Despite best efforts to proofread,  errors can occur which can change the documentation meaning.         Final Clinical Impression(s) / ED Diagnoses Final diagnoses:  Hypertensive urgency  Chest pain, unspecified type    Rx / DC Orders ED Discharge Orders     None         Virgina Norfolk, DO 09/01/21 2302

## 2021-09-01 NOTE — ED Notes (Signed)
Attempted to call floor to put up red purple man but no response.

## 2021-09-02 ENCOUNTER — Encounter (HOSPITAL_COMMUNITY): Payer: Self-pay | Admitting: Internal Medicine

## 2021-09-02 ENCOUNTER — Other Ambulatory Visit: Payer: Self-pay | Admitting: Physician Assistant

## 2021-09-02 ENCOUNTER — Observation Stay (HOSPITAL_BASED_OUTPATIENT_CLINIC_OR_DEPARTMENT_OTHER): Payer: Medicare Other

## 2021-09-02 DIAGNOSIS — E785 Hyperlipidemia, unspecified: Secondary | ICD-10-CM | POA: Diagnosis not present

## 2021-09-02 DIAGNOSIS — R079 Chest pain, unspecified: Secondary | ICD-10-CM

## 2021-09-02 DIAGNOSIS — I10 Essential (primary) hypertension: Secondary | ICD-10-CM

## 2021-09-02 DIAGNOSIS — I25118 Atherosclerotic heart disease of native coronary artery with other forms of angina pectoris: Secondary | ICD-10-CM | POA: Diagnosis not present

## 2021-09-02 DIAGNOSIS — R072 Precordial pain: Secondary | ICD-10-CM

## 2021-09-02 DIAGNOSIS — J449 Chronic obstructive pulmonary disease, unspecified: Secondary | ICD-10-CM

## 2021-09-02 LAB — CBC
HCT: 48.7 % — ABNORMAL HIGH (ref 36.0–46.0)
Hemoglobin: 17.1 g/dL — ABNORMAL HIGH (ref 12.0–15.0)
MCH: 32.2 pg (ref 26.0–34.0)
MCHC: 35.1 g/dL (ref 30.0–36.0)
MCV: 91.7 fL (ref 80.0–100.0)
Platelets: 305 10*3/uL (ref 150–400)
RBC: 5.31 MIL/uL — ABNORMAL HIGH (ref 3.87–5.11)
RDW: 12.7 % (ref 11.5–15.5)
WBC: 12.6 10*3/uL — ABNORMAL HIGH (ref 4.0–10.5)
nRBC: 0 % (ref 0.0–0.2)

## 2021-09-02 LAB — LIPID PANEL
Cholesterol: 161 mg/dL (ref 0–200)
HDL: 34 mg/dL — ABNORMAL LOW (ref >40)
LDL Cholesterol: 104 mg/dL — ABNORMAL HIGH (ref 0–99)
Total CHOL/HDL Ratio: 4.7 RATIO
Triglycerides: 113 mg/dL (ref <150)
VLDL: 23 mg/dL (ref 0–40)

## 2021-09-02 LAB — ECHOCARDIOGRAM COMPLETE
AR max vel: 2.76 cm2
AV Peak grad: 7 mmHg
Ao pk vel: 1.33 m/s
Area-P 1/2: 3.26 cm2
Height: 64 in
S' Lateral: 2.7 cm
Weight: 3012.79 oz

## 2021-09-02 LAB — BASIC METABOLIC PANEL
Anion gap: 9 (ref 5–15)
BUN: 13 mg/dL (ref 8–23)
CO2: 24 mmol/L (ref 22–32)
Calcium: 9.2 mg/dL (ref 8.9–10.3)
Chloride: 109 mmol/L (ref 98–111)
Creatinine, Ser: 0.85 mg/dL (ref 0.44–1.00)
GFR, Estimated: >60 mL/min (ref >60)
Glucose, Bld: 110 mg/dL — ABNORMAL HIGH (ref 70–99)
Potassium: 3.4 mmol/L — ABNORMAL LOW (ref 3.5–5.1)
Sodium: 142 mmol/L (ref 135–145)

## 2021-09-02 LAB — HEMOGLOBIN A1C
Hgb A1c MFr Bld: 5.5 % (ref 4.8–5.6)
Mean Plasma Glucose: 111.15 mg/dL

## 2021-09-02 LAB — MAGNESIUM: Magnesium: 2 mg/dL (ref 1.7–2.4)

## 2021-09-02 LAB — TSH: TSH: 1.264 u[IU]/mL (ref 0.350–4.500)

## 2021-09-02 LAB — HIV ANTIBODY (ROUTINE TESTING W REFLEX): HIV Screen 4th Generation wRfx: NONREACTIVE

## 2021-09-02 MED ORDER — ASPIRIN 81 MG PO TBEC
81.0000 mg | DELAYED_RELEASE_TABLET | Freq: Every day | ORAL | Status: DC
  Administered 2021-09-02: 81 mg via ORAL
  Filled 2021-09-02: qty 1

## 2021-09-02 MED ORDER — ACETAMINOPHEN 325 MG PO TABS
650.0000 mg | ORAL_TABLET | Freq: Four times a day (QID) | ORAL | Status: DC | PRN
  Administered 2021-09-02: 650 mg via ORAL
  Filled 2021-09-02 (×2): qty 2

## 2021-09-02 MED ORDER — CLONIDINE HCL 0.1 MG PO TABS
0.1000 mg | ORAL_TABLET | Freq: Two times a day (BID) | ORAL | Status: DC
  Administered 2021-09-02 (×2): 0.1 mg via ORAL
  Filled 2021-09-02 (×2): qty 1

## 2021-09-02 MED ORDER — PNEUMOCOCCAL 20-VAL CONJ VACC 0.5 ML IM SUSY
0.5000 mL | PREFILLED_SYRINGE | INTRAMUSCULAR | Status: DC
  Filled 2021-09-02: qty 0.5

## 2021-09-02 MED ORDER — DILTIAZEM HCL 60 MG PO TABS: 120.0000 mg | ORAL_TABLET | Freq: Two times a day (BID) | ORAL | Status: DC

## 2021-09-02 MED ORDER — POTASSIUM CHLORIDE 20 MEQ PO PACK
40.0000 meq | PACK | Freq: Once | ORAL | Status: AC
  Administered 2021-09-02: 40 meq via ORAL
  Filled 2021-09-02: qty 2

## 2021-09-02 MED ORDER — HYDRALAZINE HCL 20 MG/ML IJ SOLN: 10.0000 mg | INTRAMUSCULAR | Status: DC | PRN

## 2021-09-02 MED ORDER — ACETAMINOPHEN 650 MG RE SUPP: 650.0000 mg | Freq: Four times a day (QID) | RECTAL | Status: DC | PRN

## 2021-09-02 MED ORDER — HYDROCHLOROTHIAZIDE 25 MG PO TABS
25.0000 mg | ORAL_TABLET | Freq: Every day | ORAL | Status: DC
  Administered 2021-09-02: 25 mg via ORAL
  Filled 2021-09-02: qty 1

## 2021-09-02 MED ORDER — SENNOSIDES-DOCUSATE SODIUM 8.6-50 MG PO TABS: 1.0000 | ORAL_TABLET | Freq: Every evening | ORAL | Status: DC | PRN

## 2021-09-02 MED ORDER — SODIUM CHLORIDE 0.9% FLUSH
3.0000 mL | Freq: Two times a day (BID) | INTRAVENOUS | Status: DC
  Administered 2021-09-02 (×2): 3 mL via INTRAVENOUS

## 2021-09-02 MED ORDER — MOMETASONE FURO-FORMOTEROL FUM 200-5 MCG/ACT IN AERO
2.0000 | INHALATION_SPRAY | Freq: Two times a day (BID) | RESPIRATORY_TRACT | Status: DC
  Administered 2021-09-02: 2 via RESPIRATORY_TRACT
  Filled 2021-09-02: qty 8.8

## 2021-09-02 MED ORDER — ENOXAPARIN SODIUM 40 MG/0.4ML IJ SOSY
40.0000 mg | PREFILLED_SYRINGE | INTRAMUSCULAR | Status: DC
Start: 2021-09-02 — End: 2021-09-02
  Administered 2021-09-02: 40 mg via SUBCUTANEOUS
  Filled 2021-09-02: qty 0.4

## 2021-09-02 MED ORDER — IRBESARTAN 75 MG PO TABS
75.0000 mg | ORAL_TABLET | Freq: Every day | ORAL | Status: DC
  Administered 2021-09-02: 75 mg via ORAL
  Filled 2021-09-02: qty 1

## 2021-09-02 MED ORDER — CARVEDILOL 3.125 MG PO TABS
6.2500 mg | ORAL_TABLET | Freq: Two times a day (BID) | ORAL | Status: DC
  Administered 2021-09-02 (×2): 6.25 mg via ORAL
  Filled 2021-09-02 (×2): qty 2

## 2021-09-02 MED ORDER — ALBUTEROL SULFATE (2.5 MG/3ML) 0.083% IN NEBU: 2.5000 mg | INHALATION_SOLUTION | Freq: Four times a day (QID) | RESPIRATORY_TRACT | Status: DC | PRN

## 2021-09-02 MED ORDER — PNEUMOCOCCAL 20-VAL CONJ VACC 0.5 ML IM SUSY: 0.5000 mL | PREFILLED_SYRINGE | INTRAMUSCULAR | Status: DC

## 2021-09-02 MED ORDER — ROSUVASTATIN CALCIUM 5 MG PO TABS
10.0000 mg | ORAL_TABLET | Freq: Every day | ORAL | Status: DC
  Administered 2021-09-02: 10 mg via ORAL
  Filled 2021-09-02: qty 2

## 2021-09-02 NOTE — Plan of Care (Signed)

## 2021-09-02 NOTE — Assessment & Plan Note (Signed)
Continue aspirin, Coreg and Crestor.

## 2021-09-02 NOTE — Assessment & Plan Note (Addendum)
Per patient, CPAP is broken. atient was using and benefiting from their CPAP at home prior to it breaking. Likely contributing to presentation. Case management consulted for help to get patient a replacement.

## 2021-09-02 NOTE — Care Management (Addendum)
1606 Spoke w patient at bedside re her home cpap machine. She said that she has had it about 6-7 years and it broke about 2 weeks ago. She said she reached out to [employer] and her PCP, Lonie Peak at [facility], for them to coordinate orders to replace. Notified Jasmine w [employer] that patient has broken machine and it needs replaced, and to follow up with patient.  1641 Discussed with Zach from [employer] who will follow up with patient

## 2021-09-02 NOTE — Consult Note (Signed)
Cardiology Consultation:   Patient ID: [name] MRN: [id number]; DOB: Feb 01, 1955  Admit date: 09/01/2021 Date of Consult: 09/02/2021  PCP:  Ailene Ravel, MD   [facility] Providers Cardiologist:  None        Patient Profile:   [name] is a 66 y.o. female with a hx of CAD, COPD, HTN, HLD, depression, OSA on CPAP and active smoking who is being seen 09/02/2021 for the evaluation of non cardiac chest pain at the request of ED.  History of Present Illness:   Ms. Tussey reports her CPAP machine being broken for the last 2 weeks. During this time she noticed worsening headaches, fatigue and weakness. She reports episode of CP relieved with nitro (1 tab -2 min) about 2 weeks ago, and that episode felt different compared to the chest discomfort she is having now.  Denies exertional CP. Denies worsening DOE. No episodes of palpitations. No presyncope or syncope. Pt denies HF symptoms including PND, orthopnea, bendopnea. She is reporting good compliance with her medical regimen and usually SBP in 130-140, however since she is out of her CPAP she noticed it being consistently elevated to 160-180 mmHg.  In addition pt reports stable apetite, constant diarrhea and recent weight loss (about 8-10 lbs during the last month). While in ED pt is HDS. Ekg unremarkable, troponin flat x2. Remained hypertensive. ACS rulled out.   Past Medical History:  Diagnosis Date   Allergy    sinus problems   Bilateral radial fractures    Dr. Mardene Speak is managing   CAD (coronary artery disease)    Chronic respiratory failure with hypoxia (HCC)    Colonic ischemia (HCC) 03/21/2013   COPD (chronic obstructive pulmonary disease) (HCC)    Depression with anxiety    Emphysema of lung (HCC)    GERD (gastroesophageal reflux disease)    GI bleeding    Heart murmur    Dr. Dulce Sellar   Hyperlipidemia    Hypertension, essential, benign    Insomnia    Lumbar radicular pain    Obesity     Osteoarthritis of right hip    Osteoporosis    Pneumonia    hx of pneumonia after surgery   PONV (postoperative nausea and vomiting)    Sleep apnea    CPAP   Spondylolisthesis of lumbar region 12/17/2016   Umbilical hernia    Vitamin D deficiency     Past Surgical History:  Procedure Laterality Date   ABDOMINAL HYSTERECTOMY     BACK SURGERY     c-3 and c-4   CHOLECYSTECTOMY     coloctomy     5' of intesting removed   COLONOSCOPY     ESOPHAGOGASTRODUODENOSCOPY         Inpatient Medications: Scheduled Meds:  aspirin EC  81 mg Oral Daily   carvedilol  6.25 mg Oral BID   cloNIDine  0.1 mg Oral BID   [START ON 09/03/2021] diltiazem  120 mg Oral BID   enoxaparin (LOVENOX) injection  40 mg Subcutaneous Q24H   hydrochlorothiazide  25 mg Oral Daily   irbesartan  75 mg Oral Daily   mometasone-formoterol  2 puff Inhalation BID   potassium chloride  40 mEq Oral Once   rosuvastatin  10 mg Oral Daily   sodium chloride flush  3 mL Intravenous Q12H   Continuous Infusions:  PRN Meds: acetaminophen **OR** acetaminophen, albuterol, hydrALAZINE, senna-docusate  Allergies:    Allergies  Allergen Reactions   Demerol [Meperidine] Nausea And Vomiting  and Other (See Comments)    "quit breathing" - 30 years ago   Altace [Ramipril] Other (See Comments)    Cough      Social History:   Social History   Socioeconomic History   Marital status: Married    Spouse name: Not on file   Number of children: Not on file   Years of education: Not on file   Highest education level: Not on file  Occupational History   Not on file  Tobacco Use   Smoking status: Former    Types: Cigarettes    Quit date: 07/2018    Years since quitting: 3.0    Passive exposure: Past   Smokeless tobacco: Never  Vaping Use   Vaping Use: Former  Substance and Sexual Activity   Alcohol use: No   Drug use: No   Sexual activity: Not on file  Other Topics Concern   Not on file  Social History Narrative    Not on file   Social Determinants of Health   Financial Resource Strain: Not on file  Food Insecurity: Not on file  Transportation Needs: Not on file  Physical Activity: Not on file  Stress: Not on file  Social Connections: Not on file  Intimate Partner Violence: Not on file    Family History:    Family History  Problem Relation Age of Onset   Lupus Mother    Diabetes Father    Hypertension Father    Hypertension Brother    Colon cancer Paternal Grandfather    Stroke Paternal Grandfather      ROS:  Please see the history of present illness.   All other ROS reviewed and negative.     Physical Exam/Data:   Vitals:   09/01/21 2315 09/01/21 2330 09/02/21 0015 09/02/21 0026  BP: (!) 175/99 (!) 164/87 (!) 168/102   Pulse: 84 74 70   Resp: (!) 33 (!) 24 (!) 25   Temp:    97.9 F (36.6 C)  TempSrc:    Oral  SpO2: 94% 94% 93%   Weight:      Height:       No intake or output data in the 24 hours ending 09/02/21 0044    09/01/2021    7:42 PM 05/12/2021   10:45 AM 04/06/2021    1:10 PM  Last 3 Weights  Weight (lbs) 194 lb 0.1 oz 194 lb 195 lb  Weight (kg) 88 kg 87.998 kg 88.451 kg     Body mass index is 35.48 kg/m.  General:  Well nourished, well developed, in no acute distress, who looks older than her stated age HEENT: normal Neck: no JVD Vascular: No carotid bruits; Distal pulses 2+ bilaterally Cardiac:  normal S1, S2; RRR; no murmur  Lungs:  clear to auscultation bilaterally, no wheezing, rhonchi or rales  Abd: soft, nontender, no hepatomegaly  Ext: no edema Musculoskeletal:  No deformities, BUE and BLE strength normal and equal Skin: warm and dry  Neuro:  CNs 2-12 intact, no focal abnormalities noted Psych:  Normal affect   EKG:  The EKG was personally reviewed and demonstrates:  NSR  Telemetry:  Telemetry was personally reviewed and demonstrates:  no arrhythmia or conduction disease noted  Relevant CV Studies: FFRct analysis was performed on the original  cardiac CT angiogram dataset. Diagrammatic representation of the FFRct analysis is provided in a separate PDF document in PACS. This dictation was created using the PDF document and an interactive 3D model of the results.  3D model is not available in the EMR/PACS. Normal FFR range is >0.80.   1. LM: 99   2. LAD: Prox 0.97, mid 0.92, distal 0.86   3. LCX: Prox 0.98, mid 0.97, distal 0.96   4. RCA: Prox 0.99, mid 0.98, distal 0.98   IMPRESSION: No hemodynamically significant lesion identified.   Mid LAD lesion in question has low probability for hemodynamically significant stenosis.  IMPRESSION: 1. Coronary calcium score of 688. This was 60 percentile for age and sex matched control.   2. Normal coronary origin with right dominance.   3. CAD-RADS 3. Moderate stenosis (mid LAD). Consider symptom-guided anti-ischemic pharmacotherapy as well as risk factor modification per guideline directed care. Additional analysis with CT FFR will be submitted.   4. Pictures will be submitted for FFR analysis of mid LAD stenosis.       Laboratory Data:  High Sensitivity Troponin:   Recent Labs  Lab 09/01/21 2002 09/01/21 2150  TROPONINIHS 5 6     Chemistry Recent Labs  Lab 09/01/21 2002 09/01/21 2150  NA 143  --   K 3.5  --   CL 109  --   CO2 23  --   GLUCOSE 108*  --   BUN 9  --   CREATININE 0.74  --   CALCIUM 9.1  --   MG  --  2.0  GFRNONAA >60  --   ANIONGAP 11  --     No results for input(s): "PROT", "ALBUMIN", "AST", "ALT", "ALKPHOS", "BILITOT" in the last 168 hours. Lipids No results for input(s): "CHOL", "TRIG", "HDL", "LABVLDL", "LDLCALC", "CHOLHDL" in the last 168 hours.  Hematology Recent Labs  Lab 09/01/21 2002  WBC 12.6*  RBC 5.39*  HGB 17.4*  HCT 50.2*  MCV 93.1  MCH 32.3  MCHC 34.7  RDW 12.8  PLT 323   Thyroid No results for input(s): "TSH", "FREET4" in the last 168 hours.  BNPNo results for input(s): "BNP", "PROBNP" in the last 168 hours.   DDimer No results for input(s): "DDIMER" in the last 168 hours.   Radiology/Studies:  DG Chest Portable 1 View  Result Date: 09/01/2021 CLINICAL DATA:  Elevated blood pressure and chest pain EXAM: PORTABLE CHEST 1 VIEW COMPARISON:  Radiographs 03/21/2013 FINDINGS: Bibasilar scarring/atelectasis. No pleural effusion or pneumothorax. Stable cardiomediastinal silhouette. Aortic calcification. Cervical spine fusion hardware. Degenerative arthritis both shoulders. No acute osseous abnormality. IMPRESSION: No active disease. Electronically Signed   By: Minerva Fester M.D.   On: 09/01/2021 19:47     Assessment and Plan:   Hypertension worse off CPAP. Resume home antiHTN regimen Fu TTE Keep K4, mg 2 Please assist to obtain CPAP  2. Chest pain in the setting of known CAD Cw current regimen of BB and IMSN Aggressive smoking cessation Fu lipid profile and a1c  3. Weight loss and ongoing diaarhea Work up as per primary team    Risk Assessment/Risk Scores:     HEAR Score (for undifferentiated chest pain):         [facility] will sign off.   Medication Recommendations:  cw norvasc, IMSN, start metoporol succinate 25. Nitrostat prn.  Other recommendations (labs, testing, etc):  aggressive smoking cessation Follow up as an outpatient:  with Dr Dulce Sellar   For questions or updates, please contact [facility] Please consult [email or web address] for contact info under    Signed, Lenon Oms, MD  09/02/2021 12:44 AM

## 2021-09-02 NOTE — Hospital Course (Signed)
[name] is a 66 y.o. female with medical history significant for CAD, COPD, HTN, HLD, depression, OSA on CPAP who is admitted for chest pain evaluation.

## 2021-09-02 NOTE — Assessment & Plan Note (Addendum)
Stable.  Continue Symbicort and albuterol as needed. 

## 2021-09-02 NOTE — Progress Notes (Signed)
    Patient seen earlier this morning by the on call MD.  No new suggestions.  Please see the consult note.

## 2021-09-02 NOTE — ED Notes (Signed)
ED TO INPATIENT HANDOFF REPORT  ED Nurse Name and Phone #:  Batool Majid RN [phone]  S Name/Age/Gender [name] 66 y.o. female Room/Bed: 007C/007C  Code Status   Code Status: Prior  Home/SNF/Other Home Patient oriented to: self, place, time, and situation Is this baseline? Yes   Triage Complete: Triage complete  Chief Complaint Chest pain [R07.9]  Triage Note PT BIB EMS complaining of on and off CP with an onset of 6am. Pt reports being clammy and nauseated. Pain resolved after pt took her blood pressure medication. Pt then proceeded to go to an appointment with the pain clinic where they took an EKG and advised her to go to the hospital for her CP. Patient went back home instead and had her pain come back. Pt then proceeded to call EMS. Pt does state she has been keeping up with her meds but notes that her CPAP broke 3 weeks ago and has been sleeping without it.   Pt is hypertensive 160/100 with EMS.   Allergies Allergies  Allergen Reactions   Demerol [Meperidine] Nausea And Vomiting and Other (See Comments)    "quit breathing" - 30 years ago   Altace [Ramipril] Other (See Comments)    Cough      Level of Care/Admitting Diagnosis ED Disposition     ED Disposition  Admit   Condition  --   Comment  Hospital Area: [facility] [[id number]]  Level of Care: Telemetry Cardiac [[id number]]  May place patient in observation at [facility] or [place] if equivalent level of care is available:: No  Covid Evaluation: Asymptomatic - no recent exposure (last 10 days) testing not required  Diagnosis: Chest pain [id number]  Admitting Physician: Lenore Cordia [id number]  Attending Physician: Lenore Cordia [id number]          B Medical/Surgery History Past Medical History:  Diagnosis Date   Allergy    sinus problems   Bilateral radial fractures    Dr. Adin Hector is managing   CAD (coronary artery disease)    Chronic respiratory failure with hypoxia ([place])     Colonic ischemia ([place]) 03/21/2013   COPD (chronic obstructive pulmonary disease) ([place])    Depression with anxiety    Emphysema of lung (HCC)    GERD (gastroesophageal reflux disease)    GI bleeding    Heart murmur    Dr. Bettina Gavia   Hyperlipidemia    Hypertension, essential, benign    Insomnia    Lumbar radicular pain    Obesity    Osteoarthritis of right hip    Osteoporosis    Pneumonia    hx of pneumonia after surgery   PONV (postoperative nausea and vomiting)    Sleep apnea    CPAP   Spondylolisthesis of lumbar region [id number]   Umbilical hernia    Vitamin D deficiency    Past Surgical History:  Procedure Laterality Date   ABDOMINAL HYSTERECTOMY     BACK SURGERY     c-3 and c-4   CHOLECYSTECTOMY     coloctomy     5' of intesting removed   COLONOSCOPY     ESOPHAGOGASTRODUODENOSCOPY       A IV Location/Drains/Wounds Patient Lines/Drains/Airways Status     Active Line/Drains/Airways     Name Placement date Placement time Site Days   Incision (Closed) 12/17/16 Back Other (Comment) 12/17/16  1608  -- 1720            Intake/Output Last 24 hours No intake  or output data in the 24 hours ending 09/02/21 0019  Labs/Imaging Results for orders placed or performed during the hospital encounter of 09/01/21 (from the past 48 hour(s))  CBC with Differential     Status: Abnormal   Collection Time: 09/01/21  8:02 PM  Result Value Ref Range   WBC 12.6 (H) 4.0 - 10.5 K/uL   RBC 5.39 (H) 3.87 - 5.11 MIL/uL   Hemoglobin 17.4 (H) 12.0 - 15.0 g/dL   HCT 45.8 (H) 09.9 - 83.3 %   MCV 93.1 80.0 - 100.0 fL   MCH 32.3 26.0 - 34.0 pg   MCHC 34.7 30.0 - 36.0 g/dL   RDW 82.5 05.3 - 97.6 %   Platelets 323 150 - 400 K/uL   nRBC 0.0 0.0 - 0.2 %   Neutrophils Relative % 63 %   Neutro Abs 7.9 (H) 1.7 - 7.7 K/uL   Lymphocytes Relative 27 %   Lymphs Abs 3.4 0.7 - 4.0 K/uL   Monocytes Relative 8 %   Monocytes Absolute 1.1 (H) 0.1 - 1.0 K/uL   Eosinophils Relative 1 %    Eosinophils Absolute 0.2 0.0 - 0.5 K/uL   Basophils Relative 1 %   Basophils Absolute 0.1 0.0 - 0.1 K/uL   Immature Granulocytes 0 %   Abs Immature Granulocytes 0.04 0.00 - 0.07 K/uL    Comment: Performed at [facility], [address], [place], [place] [address]  Basic metabolic panel     Status: Abnormal   Collection Time: 09/01/21  8:02 PM  Result Value Ref Range   Sodium 143 135 - 145 mmol/L   Potassium 3.5 3.5 - 5.1 mmol/L   Chloride 109 98 - 111 mmol/L   CO2 23 22 - 32 mmol/L   Glucose, Bld 108 (H) 70 - 99 mg/dL    Comment: Glucose reference range applies only to samples taken after fasting for at least 8 hours.   BUN 9 8 - 23 mg/dL   Creatinine, Ser 3.79 0.44 - 1.00 mg/dL   Calcium 9.1 8.9 - 02.4 mg/dL   GFR, Estimated >09 >73 mL/min    Comment: (NOTE) Calculated using the CKD-EPI Creatinine Equation (2021)    Anion gap 11 5 - 15    Comment: Performed at [facility], [address] [place], [place] [address]  Troponin I (High Sensitivity)     Status: None   Collection Time: 09/01/21  8:02 PM  Result Value Ref Range   Troponin I (High Sensitivity) 5 <18 ng/L    Comment: (NOTE) Elevated high sensitivity troponin I (hsTnI) values and significant  changes across serial measurements may suggest ACS but many other  chronic and acute conditions are known to elevate hsTnI results.  Refer to the "Links" section for chest pain algorithms and additional  guidance. Performed at [facility], [address] [place], [place] [address]   Troponin I (High Sensitivity)     Status: None   Collection Time: 09/01/21  9:50 PM  Result Value Ref Range   Troponin I (High Sensitivity) 6 <18 ng/L    Comment: (NOTE) Elevated high sensitivity troponin I (hsTnI) values and significant  changes across serial measurements may suggest ACS but many other  chronic and acute conditions are known to elevate hsTnI results.  Refer to the "Links" section for chest pain algorithms  and additional  guidance. Performed at [facility], [address] [place], [place] [address]    DG  Chest Portable 1 View  Result Date: 09/01/2021 CLINICAL DATA:  Elevated blood pressure and chest pain EXAM: PORTABLE CHEST 1 VIEW COMPARISON:  Radiographs 03/21/2013 FINDINGS: Bibasilar scarring/atelectasis. No pleural effusion or pneumothorax. Stable cardiomediastinal silhouette. Aortic calcification. Cervical spine fusion hardware. Degenerative arthritis both shoulders. No acute osseous abnormality. IMPRESSION: No active disease. Electronically Signed   By: Minerva Fester M.D.   On: 09/01/2021 19:47    Pending Labs Unresulted Labs (From admission, onward)     Start     Ordered   09/01/21 2326  TSH  Once,   R        09/01/21 2325   09/01/21 2324  Magnesium  Once,   STAT        09/01/21 2323            Vitals/Pain Today's Vitals   09/01/21 2230 09/01/21 2315 09/01/21 2330 09/02/21 0015  BP: (!) 166/98 (!) 175/99 (!) 164/87 (!) 168/102  Pulse: 71 84 74 70  Resp: (!) 26 (!) 33 (!) 24 (!) 25  SpO2: 94% 94% 94% 93%  Weight:      Height:      PainSc:        Isolation Precautions No active isolations  Medications Medications  nitroGLYCERIN (NITROSTAT) SL tablet 0.4 mg (0.4 mg Sublingual Given 09/01/21 1952)    Mobility walks Low fall risk   Focused Assessments Cardiac Assessment Handoff:  Cardiac Rhythm: Normal sinus rhythm Lab Results  Component Value Date   TROPONINI <0.30 03/22/2013   No results found for: "DDIMER" Does the Patient currently have chest pain? No    R Recommendations: See Admitting Provider Note  Report given to: Natasha Mead RN  Additional Notes:

## 2021-09-02 NOTE — Assessment & Plan Note (Addendum)
Continue Celexa

## 2021-09-02 NOTE — Discharge Summary (Addendum)
Physician Discharge Summary   Patient: [name] MRN: [id number] DOB: 05/11/1955  Admit date:     09/01/2021  Discharge date: 09/02/21  Discharge Physician: Jacquelin Hawking, MD   PCP: Ailene Ravel, MD   Recommendations at discharge:  Outpatient follow-up with cardiology and PCP New CPAP machine  Discharge Diagnoses: Principal Problem:   Chest pain Active Problems:   CAD (coronary artery disease)   Hypertension, essential, benign   Prolonged QT interval   COPD (chronic obstructive pulmonary disease) (HCC)   Depression with anxiety   Hyperlipidemia   Sleep apnea  Resolved Problems:   * No resolved hospital problems. *  Hospital Course: [name] is a 66 y.o. female with medical history significant for CAD, COPD, HTN, HLD, depression, OSA on CPAP who is admitted for chest pain evaluation. Workup negative for likely ACS. Chest pain improved with management of hypertension.  Assessment and Plan: * Chest pain Atypical. Intermittent chest pressure likely related to hypertensive episodes. Troponin negative x2.  EKG with lateral T wave changes on EKG. Cardiology consulted and recommended medical management only. Patient to be set up with outpatient Transthoracic Echocardiogram.   CAD (coronary artery disease) Continue aspirin, Coreg and Crestor.  Prolonged QT interval EKG shows prolonged QTc >600.  May be misinterpretation due to T wave flattening. Patient monitored on telemetry with measured Qtc of 350 msec on day of discharge.  Hypertension, essential, benign BP elevated likely due to not having CPAP last 2 weeks.  Reports adherence to home meds. Patient's hydrochlorothiazide has been discontinued per reports. Continue home regimen on discharge.  Sleep apnea Per patient, CPAP is broken. atient was using and benefiting from their CPAP at home prior to it breaking. Likely contributing to presentation. Case management consulted for help to get patient a  replacement.  Hyperlipidemia Continue rosuvastatin.  Depression with anxiety Continue Celexa.  COPD (chronic obstructive pulmonary disease) (HCC) Stable. Continue Symbicort and albuterol as needed.    Consultants: Cardiology Procedures performed: None  Disposition: Home Diet recommendation: Heart healthy  DISCHARGE MEDICATION: Allergies as of 09/02/2021       Reactions   Demerol [meperidine] Nausea And Vomiting, Other (See Comments)   "quit breathing" - 30 years ago   Altace [ramipril] Other (See Comments)   Cough         Medication List     STOP taking these medications    cyclobenzaprine 10 MG tablet Commonly known as: FLEXERIL   hydrochlorothiazide 25 MG tablet Commonly known as: HYDRODIURIL       TAKE these medications    albuterol 108 (90 Base) MCG/ACT inhaler Commonly known as: VENTOLIN HFA Inhale 2 puffs into the lungs daily as needed for wheezing or shortness of breath.   albuterol (2.5 MG/3ML) 0.083% nebulizer solution Commonly known as: PROVENTIL Take 2.5 mg by nebulization every 6 (six) hours as needed for wheezing.   aspirin EC 81 MG tablet Take 81 mg by mouth daily. Swallow whole.   budesonide-formoterol 160-4.5 MCG/ACT inhaler Commonly known as: SYMBICORT Inhale 2 puffs 2 (two) times daily into the lungs.   carvedilol 6.25 MG tablet Commonly known as: COREG Take 1 tablet (6.25 mg total) by mouth 2 (two) times daily.   citalopram 40 MG tablet Commonly known as: CELEXA Take 40 mg by mouth daily.   cloNIDine 0.1 MG tablet Commonly known as: CATAPRES Take 0.1 mg by mouth 2 (two) times daily. 160/100   Co-Enzyme Q10 100 MG Caps Take 100 mg by mouth daily.  denosumab 60 MG/ML Sosy injection Commonly known as: PROLIA Inject 60 mg into the skin every 6 (six) months.   diltiazem 120 MG tablet Commonly known as: CARDIZEM Take 120 mg by mouth 2 (two) times daily.   docusate sodium 100 MG capsule Commonly known as: COLACE Take 1  capsule (100 mg total) 2 (two) times daily by mouth. What changed:  when to take this reasons to take this   ezetimibe 10 MG tablet Commonly known as: ZETIA Take 1 tablet (10 mg total) by mouth daily.   famotidine 40 MG tablet Commonly known as: PEPCID Take 40 mg by mouth daily.   gabapentin 600 MG tablet Commonly known as: NEURONTIN Take 600 mg by mouth at bedtime.   HYDROcodone-acetaminophen 7.5-325 MG tablet Commonly known as: NORCO Take 1-2 tablets by mouth 4 (four) times daily as needed for moderate pain.   isosorbide mononitrate 30 MG 24 hr tablet Commonly known as: IMDUR Take 1 tablet (30 mg total) by mouth daily.   nitroGLYCERIN 0.4 MG SL tablet Commonly known as: NITROSTAT Place 1 tablet (0.4 mg total) under the tongue every 5 (five) minutes as needed for chest pain.   rosuvastatin 10 MG tablet Commonly known as: CRESTOR Take 10 mg by mouth daily.   tiZANidine 4 MG tablet Commonly known as: ZANAFLEX Take 4 mg by mouth 3 (three) times daily as needed for muscle spasms.   valsartan 80 MG tablet Commonly known as: Diovan Take 1 tablet (80 mg total) by mouth daily.   vitamin C 1000 MG tablet Take 1,000 mg by mouth daily.   Vitamin D (Ergocalciferol) 1.25 MG (50000 UNIT) Caps capsule Commonly known as: DRISDOL Take 50,000 Units by mouth every 7 (seven) days. Thursday   zolpidem 5 MG tablet Commonly known as: AMBIEN Take 5 mg by mouth at bedtime as needed for sleep.        Follow-up Information     [facility].   Specialty: Emergency Medicine Contact information: [address] [id number] [place] [phone]               Discharge Exam: BP (!) 139/99 (BP Location: Right Arm)   Pulse 67   Temp 97.9 F (36.6 C) (Oral)   Resp 18   Ht 5\' 4"  (1.626 m)   Wt 85.4 kg   SpO2 97%   BMI 32.32 kg/m   General exam: Appears calm and comfortable Respiratory system:  Clear to auscultation. Respiratory effort normal. Cardiovascular system: S1 & S2 heard, RRR. No murmurs, rubs, gallops or clicks. Gastrointestinal system: Abdomen is nondistended, soft and nontender. No organomegaly or masses felt. Normal bowel sounds heard. Central nervous system: Alert and oriented. No focal neurological deficits. Musculoskeletal: No edema. No calf tenderness Skin: No cyanosis. No rashes Psychiatry: Judgement and insight appear normal. Mood & affect appropriate.   Condition at discharge: stable  The results of significant diagnostics from this hospitalization (including imaging, microbiology, ancillary and laboratory) are listed below for reference.   Imaging Studies: ECHOCARDIOGRAM COMPLETE  Result Date: 09/02/2021    ECHOCARDIOGRAM REPORT   Patient Name:   Rolm BookbinderKAREN LYNN Degraff Date of Exam: 09/02/2021 Medical Rec #:  [id number]           Height:       64.0 in Accession #:    [id number]          Weight:       188.3 lb Date of Birth:  1955-09-17  BSA:          1.907 m Patient Age:    66 years            BP:           139/99 mmHg Patient Gender: F                   HR:           61 bpm. Exam Location:  Inpatient Procedure: 2D Echo, Cardiac Doppler and Color Doppler Indications:    Chest Pain  History:        Patient has no prior history of Echocardiogram examinations.                 CAD, COPD; Risk Factors:Dyslipidemia, Sleep Apnea and                 Hypertension.  Sonographer:    Eduard Roux Referring Phys: 37 RHONDA G BARRETT  Sonographer Comments: Image acquisition challenging due to respiratory motion. IMPRESSIONS  1. Left ventricular ejection fraction, by estimation, is 60 to 65%. The left ventricle has normal function. The left ventricle has no regional wall motion abnormalities. There is mild concentric left ventricular hypertrophy. Left ventricular diastolic parameters are consistent with Grade I diastolic dysfunction (impaired relaxation).  2. Right ventricular  systolic function is normal. The right ventricular size is normal.  3. The mitral valve is normal in structure. Trivial mitral valve regurgitation. No evidence of mitral stenosis.  4. The aortic valve is tricuspid. Aortic valve regurgitation is trivial. No aortic stenosis is present.  5. The inferior vena cava is normal in size with greater than 50% respiratory variability, suggesting right atrial pressure of 3 mmHg. Comparison(s): No prior Echocardiogram. FINDINGS  Left Ventricle: Left ventricular ejection fraction, by estimation, is 60 to 65%. The left ventricle has normal function. The left ventricle has no regional wall motion abnormalities. The left ventricular internal cavity size was normal in size. There is  mild concentric left ventricular hypertrophy. Left ventricular diastolic parameters are consistent with Grade I diastolic dysfunction (impaired relaxation). Right Ventricle: The right ventricular size is normal. Right ventricular systolic function is normal. Left Atrium: Left atrial size was normal in size. Right Atrium: Right atrial size was normal in size. Pericardium: There is no evidence of pericardial effusion. Mitral Valve: The mitral valve is normal in structure. Trivial mitral valve regurgitation. No evidence of mitral valve stenosis. Tricuspid Valve: The tricuspid valve is normal in structure. Tricuspid valve regurgitation is trivial. No evidence of tricuspid stenosis. Aortic Valve: The aortic valve is tricuspid. Aortic valve regurgitation is trivial. No aortic stenosis is present. Aortic valve peak gradient measures 7.0 mmHg. Pulmonic Valve: The pulmonic valve was not well visualized. Pulmonic valve regurgitation is not visualized. No evidence of pulmonic stenosis. Aorta: The aortic root is normal in size and structure. Venous: The inferior vena cava is normal in size with greater than 50% respiratory variability, suggesting right atrial pressure of 3 mmHg. IAS/Shunts: No atrial level shunt  detected by color flow Doppler.  LEFT VENTRICLE PLAX 2D LVIDd:         4.40 cm   Diastology LVIDs:         2.70 cm   LV e' medial:    3.49 cm/s LV PW:         1.20 cm   LV E/e' medial:  9.2 LV IVS:        1.40 cm   LV e' lateral:  5.27 cm/s LVOT diam:     1.90 cm   LV E/e' lateral: 6.1 LV SV:         82 LV SV Index:   43 LVOT Area:     2.84 cm  RIGHT VENTRICLE             IVC RV Basal diam:  2.70 cm     IVC diam: 1.90 cm RV S prime:     10.70 cm/s TAPSE (M-mode): 1.8 cm LEFT ATRIUM             Index        RIGHT ATRIUM           Index LA diam:        4.00 cm 2.10 cm/m   RA Area:     17.10 cm LA Vol (A2C):   48.7 ml 25.54 ml/m  RA Volume:   47.00 ml  24.65 ml/m LA Vol (A4C):   53.1 ml 27.85 ml/m LA Biplane Vol: 51.7 ml 27.11 ml/m  AORTIC VALVE                 PULMONIC VALVE AV Area (Vmax): 2.76 cm     PV Vmax:       0.78 m/s AV Vmax:        132.50 cm/s  PV Peak grad:  2.5 mmHg AV Peak Grad:   7.0 mmHg LVOT Vmax:      129.00 cm/s LVOT Vmean:     77.700 cm/s LVOT VTI:       0.288 m  AORTA Ao Root diam: 3.40 cm Ao Asc diam:  3.30 cm MITRAL VALVE MV Area (PHT): 3.26 cm    SHUNTS MV Decel Time: 233 msec    Systemic VTI:  0.29 m MV E velocity: 32.10 cm/s  Systemic Diam: 1.90 cm MV A velocity: 60.00 cm/s MV E/A ratio:  0.54 Olga Millers MD Electronically signed by Olga Millers MD Signature Date/Time: 09/02/2021/1:19:50 PM    Final    DG Chest Portable 1 View  Result Date: 09/01/2021 CLINICAL DATA:  Elevated blood pressure and chest pain EXAM: PORTABLE CHEST 1 VIEW COMPARISON:  Radiographs 03/21/2013 FINDINGS: Bibasilar scarring/atelectasis. No pleural effusion or pneumothorax. Stable cardiomediastinal silhouette. Aortic calcification. Cervical spine fusion hardware. Degenerative arthritis both shoulders. No acute osseous abnormality. IMPRESSION: No active disease. Electronically Signed   By: Minerva Fester M.D.   On: 09/01/2021 19:47    Microbiology: Results for orders placed or performed during the  hospital encounter of 04/21/07  Stool culture     Status: None   Collection Time: 04/21/07  4:10 AM   Specimen: STOOL  Result Value Ref Range Status   Specimen Description STOOL  Final   Special Requests IMMUNE:NORM  Final   Culture   Final    NO SALMONELLA, SHIGELLA, CAMPYLOBACTER, OR YERSINIA ISOLATED Note: REDUCED NORMAL FLORA PRESENT   Report Status 04/25/2007 FINAL  Final  Ova and parasite examination     Status: None   Collection Time: 04/21/07  4:10 AM   Specimen: STOOL  Result Value Ref Range Status   Specimen Description STOOL  Final   Special Requests NONE  Final   Ova and parasites   Final    NO OVA OR PARASITES SEEN ABUNDANT WBC PRESENT, PREDOMINANTLY PMN   Report Status 04/22/2007 FINAL  Final  Clostridium difficile EIA     Status: None   Collection Time: 04/21/07  5:43 AM   Specimen: STOOL  Result Value  Ref Range Status   Specimen Description STOOL  Final   Special Requests IMMUNE:NORM  Final   C difficile Toxins A+B, EIA   Final    POSITIVE Initiate contact isolation if patient is in a healthcare setting. CRITICAL RESULT CALLED TO, READ BACK BY AND VERIFIED WITH: Kevin Fenton RN 04/21/07 @1900  BY PERRYA   Report Status 04/21/2007 FINAL  Final  Clostridium difficile EIA     Status: None   Collection Time: 04/22/07 10:05 AM   Specimen: STOOL  Result Value Ref Range Status   Specimen Description STOOL  Final   Special Requests IMMUNE:NORM  Final   C difficile Toxins A+B, EIA NEGATIVE  Final   Report Status 04/23/2007 FINAL  Final    Labs: CBC: Recent Labs  Lab 09/01/21 2002 09/02/21 0125  WBC 12.6* 12.6*  NEUTROABS 7.9*  --   HGB 17.4* 17.1*  HCT 50.2* 48.7*  MCV 93.1 91.7  PLT 323 305   Basic Metabolic Panel: Recent Labs  Lab 09/01/21 2002 09/01/21 2150 09/02/21 0125  NA 143  --  142  K 3.5  --  3.4*  CL 109  --  109  CO2 23  --  24  GLUCOSE 108*  --  110*  BUN 9  --  13  CREATININE 0.74  --  0.85  CALCIUM 9.1  --  9.2  MG  --  2.0   --     Discharge time spent: 35 minutes.  Signed: 11/02/21, MD [facility] 09/02/2021

## 2021-09-02 NOTE — Assessment & Plan Note (Addendum)
BP elevated likely due to not having CPAP last 2 weeks.  Reports adherence to home meds. Patient's hydrochlorothiazide has been discontinued per reports. Continue home regimen on discharge.

## 2021-09-02 NOTE — Assessment & Plan Note (Signed)
Continue rosuvastatin.  

## 2021-09-04 ENCOUNTER — Other Ambulatory Visit: Payer: Self-pay | Admitting: Cardiology

## 2021-09-12 ENCOUNTER — Telehealth: Payer: Self-pay | Admitting: Cardiology

## 2021-09-12 NOTE — Telephone Encounter (Signed)
-----   Message from Darrol Jump, PA-C sent at 09/02/2021  6:10 PM EDT ----- Patient of Dr. Dulce Sellar.  Could you please arrange a follow-up appointment in 2-4 weeks and call her.  Discharged from [facility] on Saturday.  Thank you Bjorn Loser

## 2021-09-12 NOTE — Telephone Encounter (Signed)
LVM for pt to call and schedule hosp fu/kbl 09/08/2021   LVM for pt to call and schedule a hosp fu/kbl 09/04/2021   Patient answered and hung up/kbl 09/12/21

## 2021-10-10 NOTE — Progress Notes (Deleted)
Cardiology Office Note:    Date:  10/10/2021   ID:  [name], DOB [birth year], MRN [id number]  PCP:  Ailene Ravel, MD  Cardiologist:  Norman Herrlich, MD    Referring MD: Ailene Ravel, MD    ASSESSMENT:    No diagnosis found. PLAN:    In order of problems listed above:  ***   Next appointment: ***   Medication Adjustments/Labs and Tests Ordered: Current medicines are reviewed at length with the patient today.  Concerns regarding medicines are outlined above.  No orders of the defined types were placed in this encounter.  No orders of the defined types were placed in this encounter.   No chief complaint on file.   History of Present Illness:    [name] is a 66 y.o. female with a hx of coronary artery disease hypertension stage III CKD hyperlipidemia very high coronary calcium score 688/96 percentile and moderate stenosis mid left anterior descending coronary artery on cardiac CTA 01/03/2021 last seen 05/12/2021.  She admitted to [facility] 09/02/2021 and discharged the next day.  The admission diagnosis is uncontrolled hypertension but the admission note says that she is admitted to the hospital for chest pain evaluation.  High-sensitivity troponin was normal EKG had lateral T wave changes.  She was seen by cardiology her blood pressure is recorded at 175/99 and the impression was her hypertension was poorly controlled as she was not using CPAP at home and she did not undergo further evaluation for coronary artery disease.  Dependently reviewed the hospital EKG showing sinus rhythm nonspecific T waves in a pattern suggesting previous lateral MI but on reviewing those EKGs and previous its obvious that she had inversion of the leads I and aVL causing a pseudoinfarction pattern. The initial and repeat high-sensitivity troponin were both low without a significant delta. Hest x-ray showed no acute abnormality Hemoglobin hematocrit 17.1 and 48.7  creatinine 0.85 GFR greater than 60 cc potassium 3.4 She had a lipid profile with cholesterol 161 LDL of 104  Compliance with diet, lifestyle and medications: *** Past Medical History:  Diagnosis Date   Allergy    sinus problems   Bilateral radial fractures    Dr. Mardene Speak is managing   CAD (coronary artery disease)    Chronic respiratory failure with hypoxia (HCC)    Colonic ischemia (HCC) 03/21/2013   COPD (chronic obstructive pulmonary disease) (HCC)    Depression with anxiety    Emphysema of lung (HCC)    GERD (gastroesophageal reflux disease)    GI bleeding    Heart murmur    Dr. Dulce Sellar   Hyperlipidemia    Hypertension, essential, benign    Insomnia    Lumbar radicular pain    Obesity    Osteoarthritis of right hip    Osteoporosis    Pneumonia    hx of pneumonia after surgery   PONV (postoperative nausea and vomiting)    Sleep apnea    CPAP   Spondylolisthesis of lumbar region 12/17/2016   Umbilical hernia    Vitamin D deficiency     Past Surgical History:  Procedure Laterality Date   ABDOMINAL HYSTERECTOMY     BACK SURGERY     c-3 and c-4   CHOLECYSTECTOMY     coloctomy     5' of intesting removed   COLONOSCOPY     ESOPHAGOGASTRODUODENOSCOPY      Current Medications: No outpatient medications have been marked as taking for the 10/11/21 encounter (  Appointment) with Baldo Daub, MD.     Allergies:   Demerol [meperidine] and Altace [ramipril]   Social History   Socioeconomic History   Marital status: Married    Spouse name: Not on file   Number of children: Not on file   Years of education: Not on file   Highest education level: Not on file  Occupational History   Not on file  Tobacco Use   Smoking status: Former    Types: Cigarettes    Quit date: 07/2018    Years since quitting: 3.2    Passive exposure: Past   Smokeless tobacco: Never  Vaping Use   Vaping Use: Former  Substance and Sexual Activity   Alcohol use: No   Drug use: No    Sexual activity: Not on file  Other Topics Concern   Not on file  Social History Narrative   Not on file   Social Determinants of Health   Financial Resource Strain: Not on file  Food Insecurity: Not on file  Transportation Needs: Not on file  Physical Activity: Not on file  Stress: Not on file  Social Connections: Not on file     Family History: The patient's ***family history includes Colon cancer in her paternal grandfather; Diabetes in her father; Hypertension in her brother and father; Lupus in her mother; Stroke in her paternal grandfather. ROS:   Please see the history of present illness.    All other systems reviewed and are negative.  EKGs/Labs/Other Studies Reviewed:    The following studies were reviewed today:  EKG:  EKG ordered today and personally reviewed.  The ekg ordered today demonstrates ***  Recent Labs: 09/01/2021: Magnesium 2.0 09/02/2021: BUN 13; Creatinine, Ser 0.85; Hemoglobin 17.1; Platelets 305; Potassium 3.4; Sodium 142; TSH 1.264  Recent Lipid Panel    Component Value Date/Time   CHOL 161 09/02/2021 0125   TRIG 113 09/02/2021 0125   HDL 34 (L) 09/02/2021 0125   CHOLHDL 4.7 09/02/2021 0125   VLDL 23 09/02/2021 0125   LDLCALC 104 (H) 09/02/2021 0125    Physical Exam:    VS:  There were no vitals taken for this visit.    Wt Readings from Last 3 Encounters:  09/02/21 188 lb 4.8 oz (85.4 kg)  05/12/21 194 lb (88 kg)  04/06/21 195 lb (88.5 kg)     GEN: *** Well nourished, well developed in no acute distress HEENT: Normal NECK: No JVD; No carotid bruits LYMPHATICS: No lymphadenopathy CARDIAC: ***RRR, no murmurs, rubs, gallops RESPIRATORY:  Clear to auscultation without rales, wheezing or rhonchi  ABDOMEN: Soft, non-tender, non-distended MUSCULOSKELETAL:  No edema; No deformity  SKIN: Warm and dry NEUROLOGIC:  Alert and oriented x 3 PSYCHIATRIC:  Normal affect    Signed, Norman Herrlich, MD  10/10/2021 9:44 AM    [facility]

## 2021-10-11 ENCOUNTER — Ambulatory Visit: Payer: Medicare Other | Attending: Cardiology | Admitting: Cardiology

## 2021-10-24 ENCOUNTER — Ambulatory Visit
Admission: RE | Admit: 2021-10-24 | Discharge: 2021-10-24 | Disposition: A | Discharge status: Home / Self Care | Payer: Medicare Other | Source: Ambulatory Visit | Attending: Student | Admitting: Student

## 2021-10-24 DIAGNOSIS — M542 Cervicalgia: Secondary | ICD-10-CM

## 2021-10-24 MED ORDER — DIPHENHYDRAMINE HCL 50 MG PO CAPS
50.0000 mg | ORAL_CAPSULE | Freq: Once | ORAL | Status: AC
  Administered 2021-10-24: 50 mg via ORAL

## 2021-10-24 MED ORDER — IOPAMIDOL (ISOVUE-M 300) INJECTION 61%
10.0000 mL | Freq: Once | INTRAMUSCULAR | Status: AC
  Administered 2021-10-24: 10 mL via INTRATHECAL

## 2021-10-24 MED ORDER — DIAZEPAM 5 MG PO TABS
5.0000 mg | ORAL_TABLET | Freq: Once | ORAL | Status: AC
  Administered 2021-10-24: 5 mg via ORAL

## 2021-10-24 MED ORDER — ONDANSETRON HCL 4 MG/2ML IJ SOLN: 4.0000 mg | Freq: Once | INTRAMUSCULAR | Status: DC | PRN

## 2021-10-24 NOTE — Progress Notes (Signed)
Pt reports all itching has resolved and the redness noted on her back has resolved. Pt denies any itching and has no complaints at this time.

## 2021-10-24 NOTE — Discharge Instructions (Signed)

## 2021-10-24 NOTE — Progress Notes (Signed)
Pt returned to nursing area post myelogram procedure for recovery. Pt reported mild itching to her back and head. Pt denied itching elsewhere. Pt denied any difficulty breathing, itching of her throat, swelling of er tongue. No notable hives but the patients back was red. Dr. Jeralyn Ruths was notified and assessed the patient. Per dr. Jeralyn Ruths, he was unsure if it was a reaction or not but 50 mg of benadryl was given for itching.

## 2021-11-30 ENCOUNTER — Emergency Department (HOSPITAL_COMMUNITY)
Admission: EM | Admit: 2021-11-30 | Discharge: 2021-12-01 | Disposition: A | Discharge status: Home / Self Care | Payer: Medicare Other | Attending: Emergency Medicine | Admitting: Emergency Medicine

## 2021-11-30 ENCOUNTER — Emergency Department (HOSPITAL_COMMUNITY): Payer: Medicare Other

## 2021-11-30 ENCOUNTER — Telehealth: Payer: Self-pay | Admitting: Cardiology

## 2021-11-30 ENCOUNTER — Encounter (HOSPITAL_COMMUNITY): Payer: Self-pay

## 2021-11-30 ENCOUNTER — Other Ambulatory Visit: Payer: Self-pay

## 2021-11-30 DIAGNOSIS — I10 Essential (primary) hypertension: Secondary | ICD-10-CM | POA: Diagnosis not present

## 2021-11-30 DIAGNOSIS — R030 Elevated blood-pressure reading, without diagnosis of hypertension: Secondary | ICD-10-CM | POA: Diagnosis present

## 2021-11-30 DIAGNOSIS — D72829 Elevated white blood cell count, unspecified: Secondary | ICD-10-CM | POA: Diagnosis not present

## 2021-11-30 DIAGNOSIS — J449 Chronic obstructive pulmonary disease, unspecified: Secondary | ICD-10-CM | POA: Insufficient documentation

## 2021-11-30 DIAGNOSIS — R42 Dizziness and giddiness: Secondary | ICD-10-CM | POA: Insufficient documentation

## 2021-11-30 DIAGNOSIS — I2510 Atherosclerotic heart disease of native coronary artery without angina pectoris: Secondary | ICD-10-CM | POA: Diagnosis not present

## 2021-11-30 DIAGNOSIS — Z7982 Long term (current) use of aspirin: Secondary | ICD-10-CM | POA: Diagnosis not present

## 2021-11-30 DIAGNOSIS — Z79899 Other long term (current) drug therapy: Secondary | ICD-10-CM | POA: Insufficient documentation

## 2021-11-30 LAB — BASIC METABOLIC PANEL
Anion gap: 15 (ref 5–15)
BUN: 12 mg/dL (ref 8–23)
CO2: 23 mmol/L (ref 22–32)
Calcium: 9.6 mg/dL (ref 8.9–10.3)
Chloride: 106 mmol/L (ref 98–111)
Creatinine, Ser: 0.87 mg/dL (ref 0.44–1.00)
GFR, Estimated: >60 mL/min (ref >60)
Glucose, Bld: 98 mg/dL (ref 70–99)
Potassium: 4 mmol/L (ref 3.5–5.1)
Sodium: 144 mmol/L (ref 135–145)

## 2021-11-30 LAB — CBC
HCT: 53.8 % — ABNORMAL HIGH (ref 36.0–46.0)
Hemoglobin: 17.7 g/dL — ABNORMAL HIGH (ref 12.0–15.0)
MCH: 31.7 pg (ref 26.0–34.0)
MCHC: 32.9 g/dL (ref 30.0–36.0)
MCV: 96.2 fL (ref 80.0–100.0)
Platelets: 347 10*3/uL (ref 150–400)
RBC: 5.59 MIL/uL — ABNORMAL HIGH (ref 3.87–5.11)
RDW: 13.1 % (ref 11.5–15.5)
WBC: 15.3 10*3/uL — ABNORMAL HIGH (ref 4.0–10.5)
nRBC: 0 % (ref 0.0–0.2)

## 2021-11-30 LAB — TROPONIN I (HIGH SENSITIVITY)
Troponin I (High Sensitivity): 4 ng/L (ref <18)
Troponin I (High Sensitivity): 6 ng/L (ref <18)

## 2021-11-30 MED ORDER — GADOBUTROL 1 MMOL/ML IV SOLN
8.5000 mL | Freq: Once | INTRAVENOUS | Status: AC | PRN
  Administered 2021-11-30: 8.5 mL via INTRAVENOUS

## 2021-11-30 MED ORDER — LORAZEPAM 2 MG/ML IJ SOLN
1.0000 mg | Freq: Once | INTRAMUSCULAR | Status: AC
  Administered 2021-11-30: 1 mg via INTRAVENOUS
  Filled 2021-11-30: qty 1

## 2021-11-30 NOTE — Telephone Encounter (Signed)
Spoke with pt. She stated that she is experiencing high blood pressures, Dizziness and some blurred vision which started this morning. She took 2 Nitroglycerin for her blood pressure. It was 180/114, 160/112 then came down to 144/98. She is taking all of her medications as prescribed. She denied chest pain, Shortness of breath, slurred speech, weakness.

## 2021-11-30 NOTE — Telephone Encounter (Signed)
Advised that this is stroke sx and an emergency that she needs to go to the ED as soon as possible. Pt verbalized understanding and will go to the ED for evaluation.

## 2021-11-30 NOTE — ED Provider Notes (Addendum)
[facility] EMERGENCY DEPARTMENT Provider Note   CSN: [id number] Arrival date & time: 11/30/21  1721     History  Chief Complaint  Patient presents with   Hypertension    [name] is a 66 year old female with history of CAD, COPD, HTN, HLD, depression, OSA on CPAP who presents to the ED for dizziness and elevated blood pressure. She reports that her blood pressure has been high recently, she went to her neurologist on Tuesday morning for example and her blood pressure was 180/85 there even though she had taken her blood pressure medications before going.  She reports compliance with her hypertensive regimen, without any recent changes or missed doses.  She also denies any recent diet changes.  She does report some recent sickness and was diagnosed with pneumonia 2 weeks ago, received antibiotics and steroids, and is now feeling better.  This morning, she felt "dizzy", as if the room was spinning and her head felt funny "like I was drunk."  She did not syncopize.  She laid down in her bed, but she continued to have the sensation of dizziness which lasted about 15 minutes.  She had no new weakness, slurred speech, or new numbness during this episode or other noted neurologic deficits.  She reports some chronic left arm numbness and weakness secondary to cervical states she has not been disc disease for which she needs surgery.  She reports that she had not eaten anything since yesterday, denies any melena or hematochezia, or other bleeding. She has not been consistently taking her ASA due to stomach upset with it.     The history is provided by the patient and medical records.  Hypertension       Home Medications Prior to Admission medications   Medication Sig Start Date End Date Taking? Authorizing Provider  albuterol (PROVENTIL) (2.5 MG/3ML) 0.083% nebulizer solution Take 2.5 mg by nebulization every 6 (six) hours as needed for wheezing.   Yes [provider]  Ascorbic Acid (VITAMIN C) 1000 MG tablet Take 1,000 mg by mouth daily.   Yes [provider]  aspirin EC 81 MG tablet Take 81 mg by mouth daily. Swallow whole.   Yes [provider]  Baclofen 5 MG TABS Take 1 tablet by mouth 3 (three) times daily as needed (spasms). 10/26/21  Yes [provider]  budesonide-formoterol (SYMBICORT) 160-4.5 MCG/ACT inhaler Inhale 2 puffs into the lungs 2 (two) times daily.   Yes [provider]  carvedilol (COREG) 6.25 MG tablet Take 1 tablet (6.25 mg total) by mouth 2 (two) times daily. 04/06/21  Yes Richardo Priest, MD  citalopram (CELEXA) 40 MG tablet Take 40 mg by mouth daily.   Yes [provider]  cloNIDine (CATAPRES) 0.1 MG tablet Take 0.1 mg by mouth 2 (two) times daily.   Yes [provider]  Co-Enzyme Q10 100 MG CAPS Take 100 mg by mouth daily.   Yes [provider]  denosumab (PROLIA) 60 MG/ML SOSY injection Inject 60 mg into the skin every 6 (six) months.   Yes [provider]  diltiazem (CARDIZEM) 120 MG tablet Take 120 mg by mouth 2 (two) times daily.   Yes [provider]  docusate sodium (COLACE) 100 MG capsule Take 1 capsule (100 mg total) 2 (two) times daily by mouth. Patient taking differently: Take 100 mg by mouth daily as needed for mild constipation. 12/18/16  Yes Newman Pies, MD  ezetimibe (ZETIA) 10 MG tablet  Take 1 tablet (10 mg total) by mouth daily. 01/11/21 11/30/21 Yes Richardo Priest, MD  famotidine (PEPCID) 40 MG tablet Take 40 mg by mouth daily.   Yes [provider]  gabapentin (NEURONTIN) 600 MG tablet Take 600 mg by mouth at bedtime. 11/09/20  Yes [provider]  isosorbide mononitrate (IMDUR) 30 MG 24 hr tablet Take 1 tablet (30 mg total) by mouth daily. 01/13/21 11/30/21 Yes Richardo Priest, MD  nitroGLYCERIN (NITROSTAT) 0.4 MG SL tablet Place 1 tablet (0.4 mg total) under the tongue every 5 (five) minutes as needed  for chest pain. 01/11/21  Yes Richardo Priest, MD  rosuvastatin (CRESTOR) 10 MG tablet Take 10 mg by mouth daily.   Yes [provider]  tiZANidine (ZANAFLEX) 4 MG tablet Take 4 mg by mouth 3 (three) times daily as needed for muscle spasms. 07/07/21  Yes [provider]  valsartan (DIOVAN) 80 MG tablet TAKE 1 TABLET BY MOUTH EVERY DAY 09/04/21  Yes Richardo Priest, MD  Vitamin D, Ergocalciferol, (DRISDOL) 1.25 MG (50000 UNIT) CAPS capsule Take 50,000 Units by mouth every 7 (seven) days. Thursday   Yes [provider]  zolpidem (AMBIEN) 5 MG tablet Take 5 mg by mouth at bedtime as needed for sleep.   Yes [provider]  HYDROcodone-acetaminophen (NORCO) 7.5-325 MG tablet Take 1-2 tablets by mouth 4 (four) times daily as needed for moderate pain.    [provider]      Allergies    Demerol [meperidine], Altace [ramipril], and Iodinated contrast media    Review of Systems   Review of Systems See HPI.   Physical Exam Updated Vital Signs BP (!) 132/97 (BP Location: Left Arm) Comment: Simultaneous filing. User may not have seen previous data. Comment (BP Location): Simultaneous filing. User may not have seen previous data.  Pulse 74 Comment: Simultaneous filing. User may not have seen previous data.  Temp 97.9 F (36.6 C) (Oral)   Resp 19 Comment: Simultaneous filing. User may not have seen previous data.  Ht 5\' 4"  (1.626 m)   Wt 85.3 kg   SpO2 96% Comment: Simultaneous filing. User may not have seen previous data.  BMI 32.27 kg/m   Physical Exam Vitals and nursing note reviewed.  Constitutional:      General: She is not in acute distress.    Appearance: She is not toxic-appearing.  HENT:     Head: Normocephalic and atraumatic.     Nose: Nose normal.     Mouth/Throat:     Pharynx: Oropharynx is clear.  Eyes:     Extraocular Movements: Extraocular movements intact.     Conjunctiva/sclera: Conjunctivae normal.     Pupils: Pupils are equal,  round, and reactive to light.  Cardiovascular:     Rate and Rhythm: Normal rate.     Pulses: Normal pulses.     Heart sounds: Normal heart sounds.  Pulmonary:     Effort: Pulmonary effort is normal.     Breath sounds: Normal breath sounds. No wheezing, rhonchi or rales.  Abdominal:     General: There is no distension.     Palpations: Abdomen is soft.     Tenderness: There is no abdominal tenderness.  Musculoskeletal:     Cervical back: Normal range of motion and neck supple.  Skin:    General: Skin is warm and dry.  Neurological:     Mental Status: She is alert.    Mental Status Exam:  Orientation: Alert  and oriented to person, place and time.  Memory: Cooperative, follows commands well. Recent and remote memory normal.  Attention, concentration: Attention span and concentration are normal.  Language: Speech is clear and language is normal.  Fund of knowledge: Vocabulary appropriate for patient age.   Cranial Nerves:   CN 2 (Optic): Visual fields intact to confrontation. CN 3,4,6 (EOM): Pupils equal and reactive to light. Full extraocular eye movement without nystagmus.  CN 5 (Trigeminal): Facial sensation is normal, no weakness of masticatory muscles.  CN 7 (Facial): No facial weakness or asymmetry.  CN 8 (Auditory): Auditory acuity grossly normal.  CN 9,10 (Glossophar): The uvula is midline, the palate elevates symmetrically.  CN 11 (spinal access): Normal sternocleidomastoid and trapezius strength.  CN 12 (Hypoglossal): The tongue is midline. No atrophy or fasciculations.  Motor:  Strength: 5/5 and symmetric in the upper and lower extremities, no pronation or drift. Tone: Tone and muscle bulk are normal in the upper and lower extremities.   Coordination: Intact finger-to-nose, mild left hand tremor at baseline per patient.   Sensation: Intact to light touch throughout.   Gait: Routine gait at baseline, slight limp due to chronic right knee pain per patient.     ED  Results / Procedures / Treatments   Labs (all labs ordered are listed, but only abnormal results are displayed) Labs Reviewed  CBC - Abnormal; Notable for the following components:      Result Value   WBC 15.3 (*)    RBC 5.59 (*)    Hemoglobin 17.7 (*)    HCT 53.8 (*)    All other components within normal limits  BASIC METABOLIC PANEL  TROPONIN I (HIGH SENSITIVITY)  TROPONIN I (HIGH SENSITIVITY)    EKG None  Radiology CT HEAD WO CONTRAST (5MM)  Result Date: 11/30/2021 CLINICAL DATA:  Headache EXAM: CT HEAD WITHOUT CONTRAST TECHNIQUE: Contiguous axial images were obtained from the base of the skull through the vertex without intravenous contrast. RADIATION DOSE REDUCTION: This exam was performed according to the departmental dose-optimization program which includes automated exposure control, adjustment of the mA and/or kV according to patient size and/or use of iterative reconstruction technique. COMPARISON:  None Available. FINDINGS: Brain: Normal anatomic configuration. Parenchymal volume loss is commensurate with the patient's age. Mild periventricular white matter changes are present likely reflecting the sequela of small vessel ischemia. No abnormal intra or extra-axial mass lesion or fluid collection. No abnormal mass effect or midline shift. No evidence of acute intracranial hemorrhage or infarct. Ventricular size is normal. Cerebellum unremarkable. Vascular: No asymmetric hyperdense vasculature at the skull base. Skull: Intact Sinuses/Orbits: Paranasal sinuses are clear. Orbits are unremarkable. Other: Mastoid air cells and middle ear cavities are clear. IMPRESSION: 1. No acute intracranial hemorrhage or infarct. 2. Mild senescent change. Electronically Signed   By: Fidela Salisbury M.D.   On: 11/30/2021 19:18   DG Chest 2 View  Result Date: 11/30/2021 CLINICAL DATA:  Dizziness, hypertension, shoulder pain EXAM: CHEST - 2 VIEW COMPARISON:  09/01/2021 FINDINGS: Cardiac size is within  normal limits. There are no signs of pulmonary edema or focal pulmonary consolidation. There is no significant pleural effusion or pneumothorax. There is surgical fusion in lower cervical spine. Degenerative changes are noted in both shoulders, more so on the left side. IMPRESSION: No active cardiopulmonary disease. Electronically Signed   By: Elmer Picker M.D.   On: 11/30/2021 18:10    Procedures Procedures    Medications Ordered in ED Medications  LORazepam (ATIVAN) injection  1 mg (1 mg Intravenous Given 11/30/21 2237)  gadobutrol (GADAVIST) 1 MMOL/ML injection 8.5 mL (8.5 mLs Intravenous Contrast Given 11/30/21 2306)    ED Course/ Medical Decision Making/ A&P                           Medical Decision Making Problems Addressed: Dizziness: acute illness or injury that poses a threat to life or bodily functions Hypertension, unspecified type: chronic illness or injury with exacerbation, progression, or side effects of treatment  Amount and/or Complexity of Data Reviewed Independent Historian: caregiver External Data Reviewed: labs and notes. Labs: ordered. Decision-making details documented in ED Course. Radiology: ordered and independent interpretation performed. Decision-making details documented in ED Course. Discussion of management or test interpretation with external provider(s): Neurology   Risk Prescription drug management.   MDM Ruther Single is a 66 y.o. female who presents with dizziness lasting 15 minutes in the setting of hypertension as per above, found to have initial triage BP of 134/105 after home BP medications and 3 nitroglycerin at home. After being roomed, BP now 145/63.   On chart review, patient was admitte din August 2023 for chest pain thought to be secondary to hypertensive urgency. At that time she had negative serial troponins and was discharged on carvedilol 6.25 mg BID, clonidine 0.1 mg BID, diltiazem 120 mg BID, iImdur 30 mg daily. Baseline  HTN per oupatient records appears to be ~systolic [id number].    DDX considered included (but not limited to): Hypertensive emergency, hypertensive urgency considered.  Patient reports medication compliance.  No obvious underlying trigger as she denies any changes in health status or diet recently.  No shortness of breath or crackles on lung auscultation or hypoxia to suggest acute pulmonary edema.  Patient does report headache, however states that this only occurred after taking nitroglycerin and has now resolved.  Suspect that this is secondary to vasodilation in the setting of nitroglycerin use, and not due to underlying intracranial pathology as patient has a normal neurologic exam now.  We will obtain head CT however to further evaluate.   Plan: Will obtain labs to evaluate for end-organ damage, CXR to evaluate for pulmonary edema or mediastinal widening. No chest pain, pulse deficits, or neurologic deficits to suggest aortic dissection therefore do not feel that CTA is warranted at this time. Concern for possible TIA in posterior circulation given 15 minutes of vertigo even at rest earlier with multiple CVA risk factors and decreased ASA use recently.    Testing Results: Lab results: CBC shows leukocytosis with WBC 15, hemoglobin 17, appears overall hemoconcentrated.  This does appear similar to priors, and feel leukocytosis is less likely secondary to recent pneumonia and not new infection today as patient without fever or new infectious symptoms.  BMP unremarkable, creatinine within normal limits at 0.87, no electrolyte derangements, glucose within normal limits at 98.  Serial troponins 4, 6.   Imaging results: CTH on my review shows no acute intracranial pathology. CXR on my review shows no widened mediastinum, enlarged cardiac silhouette, focal consolidation to suggest pneumonia, or pulmonary edema/pleural effusion.   All radiography studies, electrocardiograms, and laboratory data were  personally reviewed by me and incorporated into my medical decision making.  EKG on my review shows NSR, 69 bpm.  No ST elevations or depressions, no T wave inversions to suggest acute ischemia.  The PR, QRS, and QTc are appropriate.  No high-grade conduction block or arrhythmia.  Overall unremarkable EKG.  Overall laboratory studies demonstrate no evidence of target organ damage (no elevation in creatinine to suggest AKI, no elevation in troponin or ischemic EKG changes to suggest type II MI, no altered mental status or neurologic deficits to suggest hypertensive encephalopathy and head CT without signs of acute CVA, no hypoxia or shortness of breath to suggest pulmonary edema and chest x-ray does not demonstrate evidence of such). Given several stroke risk factors including hypertension, obesity, hyperlipidemia, and smoking as well as episode of vertigo lasting 15 minutes persisting even at rest earlier today, ideas, concerns for possible TIA.  Neurology consulted Rory Percy), recommended brain MRI given low risk ABCD2 score of 3. This was ordered, please see note from oncoming provider for further details at this was pending at time of signout.    Note: Estate manager/land agent was used in the creation of this note. Grammatical errors may be present.          Final Clinical Impression(s) / ED Diagnoses Final diagnoses:  Hypertension, unspecified type  Dizziness    Rx / DC Orders ED Discharge Orders     None         Renard Matter, MD 12/01/21 5027    Renard Matter, MD 12/01/21 Quentin Mulling    Malvin Johns, MD 12/01/21 1507

## 2021-11-30 NOTE — ED Provider Triage Note (Signed)
Emergency Medicine Provider Triage Evaluation Note  [name] , a 66 y.o. female  was evaluated in triage.  Pt complains of headache and elevated blood pressure.  Patient transported by EMS.  States that she has started having "trouble" with her blood pressure in the early hours of the morning.  This included blurry vision, headache.  Patient denies other signs of stroke including: facial droop, slurred speech, aphasia, weakness/numbness in extremities, imbalance/trouble walking.  She has taken extra dose of blood pressure medicine as well as nitroglycerin.  Patient has neck and shoulder pains which she relates to disc problem in her neck.  No chest pain or shortness of breath.  She has blurry vision, worse in the periphery, but no loss of vision.   Review of Systems  Positive: Headache Negative: Chest pain  Physical Exam  BP (!) 134/105 (BP Location: Right Arm)   Pulse 66   Temp 98 F (36.7 C) (Oral)   Resp 20   Ht 5\' 4"  (1.626 m)   Wt 85.3 kg   SpO2 95%   BMI 32.27 kg/m  Gen:   Awake, no distress   Resp:  Normal effort  MSK:   Moves extremities without difficulty  Other:  No lower extremity edema, regular rhythm --I do not get the sense that patient has any peripheral vision loss on exam  Medical Decision Making  Medically screening exam initiated at 5:28 PM.  Appropriate orders placed.  Karel Mowers was informed that the remainder of the evaluation will be completed by another provider, this initial triage assessment does not replace that evaluation, and the importance of remaining in the ED until their evaluation is complete.     Carlisle Cater, PA-C 11/30/21 1729

## 2021-11-30 NOTE — ED Triage Notes (Signed)
Complains of HTN and headache and attempted to get BP down with 3 nitro and extra bp med.  Complains of pain in left shoulder and neck but is no new and reports left arm is numb and tingling but that is due to disc in neck.  COmplains of blurred vision.  Reports does not have great peripheral vision on either side.  Denies CP SOB

## 2021-11-30 NOTE — ED Notes (Signed)
Patient transported to MRI 

## 2021-11-30 NOTE — Telephone Encounter (Signed)
Pt c/o BP issue: STAT if pt c/o blurred vision, one-sided weakness or slurred speech  1. What are your last 5 BP readings?  180/114 160/112 after taking Nitroglycerin   2. Are you having any other symptoms (ex. Dizziness, headache, blurred vision, passed out)? Dizziness and blurred vision.   3. What is your BP issue? Pt states that she can not get her bp down without taking nitroglycerin.

## 2021-11-30 NOTE — ED Notes (Signed)
Repeat BP 154/123

## 2021-12-01 NOTE — ED Provider Notes (Signed)
I assumed care of this patient.  Please see previous provider note for further details of Hx, PE.  Briefly patient is a 66 y.o. female who presented dizziness and hypertension pending MRI to rule out stroke. ABCD2 score of 3. Neuro consulted: if MRI negative, she can be discharged with outpatient neurology follow up.  MRI negative. Patient feels better.  The patient appears reasonably screened and/or stabilized for discharge and I doubt any other medical condition or other [facility] requiring further screening, evaluation, or treatment in the ED at this time. I have discussed the findings, Dx and Tx plan with the patient/family who expressed understanding and agree(s) with the plan. Discharge instructions discussed at length. The patient/family was given strict return precautions who verbalized understanding of the instructions. No further questions at time of discharge.  Disposition: Discharge  Condition: Good  ED Discharge Orders     None        Follow Up: Hamrick, Lorin Mercy, MD [place] [address] [phone]  Call  to schedule an appointment for close follow up  Neurologist  Call  to schedule an appointment for close follow up          [place] Mecca, Grayce Sessions, MD 12/01/21 0101

## 2021-12-04 ENCOUNTER — Telehealth: Payer: Self-pay | Admitting: *Deleted

## 2021-12-04 NOTE — Progress Notes (Unsigned)
Office Visit    Patient Name: [name] Date of Encounter: 12/04/2021  PCP:  Leonides Sake, MD   [facility]  Cardiologist:  Shirlee More, MD  Advanced Practice Provider:  No care team member to display Electrophysiologist:  None   HPI    [name] is a 66 y.o. female with a past medical history of hypertension, CAD, Agatston coronary calcium score greater than 400, and hyperlipidemia presents today for hospital follow-up.   Presented to the ED 11/2 with a chief complaint of dizziness and hypertension pending MRI to rule out stroke.  MRI was negative and the patient felt better.  She also had some chronic left arm numbness and weakness secondary to cervical disc disease for which she needs surgery.  Blood pressure was better controlled at discharge 132/97.   She was last seen by Dr. Bettina Gavia 05/12/2021.  At that appointment blood pressure was better controlled.  She was on carvedilol 6.25 mg twice daily, clonidine 0.1 mg twice daily, diltiazem 120 mg twice daily, Lasix 20 mg daily, HCTZ 25 mg daily, and Diovan 80 mg daily.   Today, she ***  Past Medical History    Past Medical History:  Diagnosis Date   Allergy    sinus problems   Bilateral radial fractures    Dr. Adin Hector is managing   CAD (coronary artery disease)    Chronic respiratory failure with hypoxia ([place])    Colonic ischemia ([place]) 03/21/2013   COPD (chronic obstructive pulmonary disease) ([place])    Depression with anxiety    Emphysema of lung (H[facility])    GERD (gastroesophageal reflux disease)    GI bleeding    Heart murmur    Dr. Bettina Gavia   Hyperlipidemia    Hypertension, essential, benign    Insomnia    Lumbar radicular pain    Obesity    Osteoarthritis of right hip    Osteoporosis    Pneumonia    hx of pneumonia after surgery   PONV (postoperative nausea and vomiting)    Sleep apnea    CPAP   Spondylolisthesis of lumbar region [id number]   Umbilical hernia     Vitamin D deficiency    Past Surgical History:  Procedure Laterality Date   ABDOMINAL HYSTERECTOMY     BACK SURGERY     c-3 and c-4   CHOLECYSTECTOMY     coloctomy     5' of intesting removed   COLONOSCOPY     ESOPHAGOGASTRODUODENOSCOPY      Allergies  Allergies  Allergen Reactions   Demerol [Meperidine] Nausea And Vomiting and Other (See Comments)    "quit breathing" - 30 years ago   Altace [Ramipril] Other (See Comments)    Cough     Iodinated Contrast Media Itching    After CT myelogram procedure. Pt developed mild itching and redness on her back. 50 mg of benadryl given and it resolved.     EKGs/Labs/Other Studies Reviewed:   The following studies were reviewed today:  Echocardiogram 09/02/2021 IMPRESSIONS     1. Left ventricular ejection fraction, by estimation, is 60 to 65%. The  left ventricle has normal function. The left ventricle has no regional  wall motion abnormalities. There is mild concentric left ventricular  hypertrophy. Left ventricular diastolic  parameters are consistent with Grade I diastolic dysfunction (impaired  relaxation).   2. Right ventricular systolic function is normal. The right ventricular  size is normal.   3. The mitral valve  is normal in structure. Trivial mitral valve  regurgitation. No evidence of mitral stenosis.   4. The aortic valve is tricuspid. Aortic valve regurgitation is trivial.  No aortic stenosis is present.   5. The inferior vena cava is normal in size with greater than 50%  respiratory variability, suggesting right atrial pressure of 3 mmHg.   Comparison(s): No prior Echocardiogram.   FINDINGS   Left Ventricle: Left ventricular ejection fraction, by estimation, is 60  to 65%. The left ventricle has normal function. The left ventricle has no  regional wall motion abnormalities. The left ventricular internal cavity  size was normal in size. There is   mild concentric left ventricular hypertrophy. Left ventricular  diastolic  parameters are consistent with Grade I diastolic dysfunction (impaired  relaxation).   Right Ventricle: The right ventricular size is normal. Right ventricular  systolic function is normal.   Left Atrium: Left atrial size was normal in size.   Right Atrium: Right atrial size was normal in size.   Pericardium: There is no evidence of pericardial effusion.   Mitral Valve: The mitral valve is normal in structure. Trivial mitral  valve regurgitation. No evidence of mitral valve stenosis.   Tricuspid Valve: The tricuspid valve is normal in structure. Tricuspid  valve regurgitation is trivial. No evidence of tricuspid stenosis.   Aortic Valve: The aortic valve is tricuspid. Aortic valve regurgitation is  trivial. No aortic stenosis is present. Aortic valve peak gradient  measures 7.0 mmHg.   Pulmonic Valve: The pulmonic valve was not well visualized. Pulmonic valve  regurgitation is not visualized. No evidence of pulmonic stenosis.   Aorta: The aortic root is normal in size and structure.   Venous: The inferior vena cava is normal in size with greater than 50%  respiratory variability, suggesting right atrial pressure of 3 mmHg.   IAS/Shunts: No atrial level shunt detected by color flow Doppler.   EKG:  EKG is *** ordered today.  The ekg ordered today demonstrates ***  Recent Labs: 09/01/2021: Magnesium 2.0 09/02/2021: TSH 1.264 11/30/2021: BUN 12; Creatinine, Ser 0.87; Hemoglobin 17.7; Platelets 347; Potassium 4.0; Sodium 144  Recent Lipid Panel    Component Value Date/Time   CHOL 161 09/02/2021 0125   TRIG 113 09/02/2021 0125   HDL 34 (L) 09/02/2021 0125   CHOLHDL 4.7 09/02/2021 0125   VLDL 23 09/02/2021 0125   LDLCALC 104 (H) 09/02/2021 0125    Risk Assessment/Calculations:  {Does this patient have ATRIAL FIBRILLATION?[phone]}  Home Medications   No outpatient medications have been marked as taking for the 12/06/21 encounter (Appointment) with Elgie Collard, PA-C.     Review of Systems   ***   All other systems reviewed and are otherwise negative except as noted above.  Physical Exam    VS:  There were no vitals taken for this visit. , BMI There is no height or weight on file to calculate BMI.  Wt Readings from Last 3 Encounters:  11/30/21 188 lb (85.3 kg)  09/02/21 188 lb 4.8 oz (85.4 kg)  05/12/21 194 lb (88 kg)     GEN: Well nourished, well developed, in no acute distress. HEENT: normal. Neck: Supple, no JVD, carotid bruits, or masses. Cardiac: ***RRR, no murmurs, rubs, or gallops. No clubbing, cyanosis, edema.  ***Radials/PT 2+ and equal bilaterally.  Respiratory:  ***Respirations regular and unlabored, clear to auscultation bilaterally. GI: Soft, nontender, nondistended. MS: No deformity or atrophy. Skin: Warm and dry, no rash. Neuro:  Strength and  sensation are intact. Psych: Normal affect.  Assessment & Plan    Hypertension CAD Hyperlipidemia  No BP recorded.  {Refresh Note OR Click here to enter BP  :1}***      Disposition: Follow up {follow up:15908} with Shirlee More, MD or APP.  Signed, Elgie Collard, PA-C 12/04/2021, 9:38 AM [place]

## 2021-12-06 ENCOUNTER — Encounter: Payer: Self-pay | Admitting: Physician Assistant

## 2021-12-06 ENCOUNTER — Ambulatory Visit: Payer: Medicare Other | Admitting: Physician Assistant

## 2021-12-06 ENCOUNTER — Ambulatory Visit: Payer: Medicare Other | Attending: Physician Assistant | Admitting: Physician Assistant

## 2021-12-06 VITALS — BP 122/88 | HR 63 | Ht 64.0 in | Wt 194.0 lb

## 2021-12-06 DIAGNOSIS — I10 Essential (primary) hypertension: Secondary | ICD-10-CM | POA: Diagnosis not present

## 2021-12-06 DIAGNOSIS — I2584 Coronary atherosclerosis due to calcified coronary lesion: Secondary | ICD-10-CM | POA: Diagnosis not present

## 2021-12-06 DIAGNOSIS — I2510 Atherosclerotic heart disease of native coronary artery without angina pectoris: Secondary | ICD-10-CM | POA: Diagnosis not present

## 2021-12-06 DIAGNOSIS — E785 Hyperlipidemia, unspecified: Secondary | ICD-10-CM

## 2021-12-06 DIAGNOSIS — E782 Mixed hyperlipidemia: Secondary | ICD-10-CM | POA: Diagnosis not present

## 2021-12-06 DIAGNOSIS — I25118 Atherosclerotic heart disease of native coronary artery with other forms of angina pectoris: Secondary | ICD-10-CM | POA: Diagnosis not present

## 2021-12-06 MED ORDER — EZETIMIBE 10 MG PO TABS: 10.0000 mg | ORAL_TABLET | Freq: Every day | ORAL | 3 refills | Status: DC

## 2021-12-06 MED ORDER — ISOSORBIDE MONONITRATE ER 30 MG PO TB24: 30.0000 mg | ORAL_TABLET | Freq: Every day | ORAL | 3 refills | Status: DC

## 2021-12-06 NOTE — Progress Notes (Signed)
Cardiology Office Note:    Date:  12/06/2021   ID:  [name], DOB August 09, 1955, MRN [id number]  PCP:  Ailene Ravel, MD  [facility] Cardiologist:  Norman Herrlich, MD  [facility] Electrophysiologist:  None   Chief Complaint: hospital follow up   History of Present Illness:    [name] is a 66 y.o. female with a hx of hypertension, CAD by coronary CT>>> Agatston coronary calcium score greater than 400, spinal stenosis and hyperlipidemia presents today for hospital follow-up.   Coronary CT December 2022: coronary calcium score of 688.  96 percentile.  No significant stenosis by FFR study.  She was last seen by Dr. Dulce Sellar 05/12/2021.  At that appointment blood pressure was better controlled.  She was on carvedilol 6.25 mg twice daily, clonidine 0.1 mg twice daily, diltiazem 120 mg twice daily, Lasix 20 mg daily, HCTZ 25 mg daily, and Diovan 80 mg daily.   Presented to the ED 11/2 with a chief complaint of dizziness and hypertension. MRI was negative and the patient felt better.  She also had some chronic left arm numbness and weakness secondary to cervical disc disease for which she needs surgery.  Blood pressure was better controlled at discharge 132/97. pending outpatient neurology follow up.   Patient is here for follow-up.  She has history of C4 surgery.  CT cervical spine 09/2021 with moderate spinal stenosis of C6-7.  Patient reports left-sided numbness and tingling radiating from her neck to her left arm.  Unable to lift her arm.  Also reporting muscle spasm on upper back.  Blood pressure fluctuates, mostly elevated with the pain.  Normal today.  Denies chest pain, shortness of breath, orthopnea, PND, syncope, lower extremity edema or melena.  Past Medical History:  Diagnosis Date   Allergy    sinus problems   Bilateral radial fractures    Dr. Mardene Speak is managing   CAD (coronary artery disease)    Chronic respiratory failure with hypoxia ([facility])    Colonic  ischemia ([facility]) 03/21/2013   COPD (chronic obstructive pulmonary disease) (HCC)    Depression with anxiety    Emphysema of lung (HCC)    GERD (gastroesophageal reflux disease)    GI bleeding    Heart murmur    Dr. Dulce Sellar   Hyperlipidemia    Hypertension, essential, benign    Insomnia    Lumbar radicular pain    Obesity    Osteoarthritis of right hip    Osteoporosis    Pneumonia    hx of pneumonia after surgery   PONV (postoperative nausea and vomiting)    Sleep apnea    CPAP   Spondylolisthesis of lumbar region 12/17/2016   Umbilical hernia    Vitamin D deficiency     Past Surgical History:  Procedure Laterality Date   ABDOMINAL HYSTERECTOMY     BACK SURGERY     c-3 and c-4   CHOLECYSTECTOMY     coloctomy     5' of intesting removed   COLONOSCOPY     ESOPHAGOGASTRODUODENOSCOPY      Current Medications: Current Meds  Medication Sig   albuterol (PROVENTIL) (2.5 MG/3ML) 0.083% nebulizer solution Take 2.5 mg by nebulization every 6 (six) hours as needed for wheezing.   Ascorbic Acid (VITAMIN C) 1000 MG tablet Take 1,000 mg by mouth daily.   aspirin EC 81 MG tablet Take 81 mg by mouth daily. Swallow whole.   Baclofen 5 MG TABS Take 1 tablet by mouth 3 (  three) times daily as needed (spasms).   budesonide-formoterol (SYMBICORT) 160-4.5 MCG/ACT inhaler Inhale 2 puffs into the lungs 2 (two) times daily.   carvedilol (COREG) 6.25 MG tablet Take 1 tablet (6.25 mg total) by mouth 2 (two) times daily.   citalopram (CELEXA) 40 MG tablet Take 40 mg by mouth daily.   cloNIDine (CATAPRES) 0.1 MG tablet Take 0.1 mg by mouth 2 (two) times daily.   Co-Enzyme Q10 100 MG CAPS Take 100 mg by mouth daily.   denosumab (PROLIA) 60 MG/ML SOSY injection Inject 60 mg into the skin every 6 (six) months.   diltiazem (CARDIZEM) 120 MG tablet Take 120 mg by mouth 2 (two) times daily.   docusate sodium (COLACE) 100 MG capsule Take 1 capsule (100 mg total) 2 (two) times daily by mouth. (Patient taking  differently: Take 100 mg by mouth daily as needed for mild constipation.)   famotidine (PEPCID) 40 MG tablet Take 40 mg by mouth daily.   gabapentin (NEURONTIN) 600 MG tablet Take 600 mg by mouth at bedtime.   HYDROcodone-acetaminophen (NORCO) 7.5-325 MG tablet Take 1-2 tablets by mouth 4 (four) times daily as needed for moderate pain.   nitroGLYCERIN (NITROSTAT) 0.4 MG SL tablet Place 1 tablet (0.4 mg total) under the tongue every 5 (five) minutes as needed for chest pain.   rosuvastatin (CRESTOR) 10 MG tablet Take 10 mg by mouth daily.   tiZANidine (ZANAFLEX) 4 MG tablet Take 4 mg by mouth 3 (three) times daily as needed for muscle spasms.   valsartan (DIOVAN) 80 MG tablet TAKE 1 TABLET BY MOUTH EVERY DAY   Vitamin D, Ergocalciferol, (DRISDOL) 1.25 MG (50000 UNIT) CAPS capsule Take 50,000 Units by mouth every 7 (seven) days. Thursday   zolpidem (AMBIEN) 5 MG tablet Take 5 mg by mouth at bedtime as needed for sleep.     Allergies:   Demerol [meperidine], Altace [ramipril], and Iodinated contrast media   Social History   Socioeconomic History   Marital status: Married    Spouse name: Not on file   Number of children: Not on file   Years of education: Not on file   Highest education level: Not on file  Occupational History   Not on file  Tobacco Use   Smoking status: Former    Types: Cigarettes    Quit date: 07/2018    Years since quitting: 3.3    Passive exposure: Past   Smokeless tobacco: Never  Vaping Use   Vaping Use: Former  Substance and Sexual Activity   Alcohol use: No   Drug use: No   Sexual activity: Not on file  Other Topics Concern   Not on file  Social History Narrative   Not on file   Social Determinants of Health   Financial Resource Strain: Not on file  Food Insecurity: Not on file  Transportation Needs: Not on file  Physical Activity: Not on file  Stress: Not on file  Social Connections: Not on file     Family History: The patient's family history  includes Colon cancer in her paternal grandfather; Diabetes in her father; Hypertension in her brother and father; Lupus in her mother; Stroke in her paternal grandfather.    ROS:   Please see the history of present illness.    All other systems reviewed and are negative.   EKGs/Labs/Other Studies Reviewed:    The following studies were reviewed today:  Echo 08/2021   1. Left ventricular ejection fraction, by estimation, is 60 to  65%. The  left ventricle has normal function. The left ventricle has no regional  wall motion abnormalities. There is mild concentric left ventricular  hypertrophy. Left ventricular diastolic  parameters are consistent with Grade I diastolic dysfunction (impaired  relaxation).   2. Right ventricular systolic function is normal. The right ventricular  size is normal.   3. The mitral valve is normal in structure. Trivial mitral valve  regurgitation. No evidence of mitral stenosis.   4. The aortic valve is tricuspid. Aortic valve regurgitation is trivial.  No aortic stenosis is present.   5. The inferior vena cava is normal in size with greater than 50%  respiratory variability, suggesting right atrial pressure of 3 mmHg.   EKG:  EKG is not  ordered today.    Recent Labs: 09/01/2021: Magnesium 2.0 09/02/2021: TSH 1.264 11/30/2021: BUN 12; Creatinine, Ser 0.87; Hemoglobin 17.7; Platelets 347; Potassium 4.0; Sodium 144  Recent Lipid Panel    Component Value Date/Time   CHOL 161 09/02/2021 0125   TRIG 113 09/02/2021 0125   HDL 34 (L) 09/02/2021 0125   CHOLHDL 4.7 09/02/2021 0125   VLDL 23 09/02/2021 0125   LDLCALC 104 (H) 09/02/2021 0125    Physical Exam:    VS:  BP 122/88   Pulse 63   Ht 5\' 4"  (1.626 m)   Wt 194 lb (88 kg)   SpO2 94%   BMI 33.30 kg/m     Wt Readings from Last 3 Encounters:  12/06/21 194 lb (88 kg)  11/30/21 188 lb (85.3 kg)  09/02/21 188 lb 4.8 oz (85.4 kg)     GEN:  Well nourished, well developed in no acute distress HEENT:  Normal NECK: No JVD; No carotid bruits LYMPHATICS: No lymphadenopathy CARDIAC: RRR, no murmurs, rubs, gallops RESPIRATORY:  Clear to auscultation without rales, wheezing or rhonchi  ABDOMEN: Soft, non-tender, non-distended MUSCULOSKELETAL:  No edema; No deformity  SKIN: Warm and dry NEUROLOGIC:  Alert and oriented x 3 PSYCHIATRIC:  Normal affect   ASSESSMENT AND PLAN:    Nonobstructive CAD by coronary CTA December 2022 No significant stenosis by FFR study.  No anginal symptoms.  Continue aspirin, statin, Zetia, Imdur and beta-blocker.  2.  Hypertension Blood pressure stable currently.  Elevated episode likely due to neck pain.  3.  Spinal stenosis Her symptoms consistent with spinal etiology.  She is trying to reach her doctor but not successful.  If she requires surgery or injections she will be cleared at acceptable risk without additional testing. -Echocardiogram in 08/2021 with normal LV function.  No regional wall motion abnormality.  4. HLD - Continue Zetia and statin   Medication Adjustments/Labs and Tests Ordered: Current medicines are reviewed at length with the patient today.  Concerns regarding medicines are outlined above.  No orders of the defined types were placed in this encounter.  Meds ordered this encounter  Medications   isosorbide mononitrate (IMDUR) 30 MG 24 hr tablet    Sig: Take 1 tablet (30 mg total) by mouth daily.    Dispense:  90 tablet    Refill:  3   ezetimibe (ZETIA) 10 MG tablet    Sig: Take 1 tablet (10 mg total) by mouth daily.    Dispense:  90 tablet    Refill:  3    Patient Instructions  Medication Instructions:   Your physician recommends that you continue on your current medications as directed. Please refer to the Current Medication list given to you today.   *If you  need a refill on your cardiac medications before your next appointment, please call your pharmacy*   Lab Work:  None ordered.  If you have labs (blood work)  drawn today and your tests are completely normal, you will receive your results only by: MyChart Message (if you have MyChart) OR A paper copy in the mail If you have any lab test that is abnormal or we need to change your treatment, we will call you to review the results.   Testing/Procedures:  None ordered.   Follow-Up: At [facility], you and your health needs are our priority.  As part of our continuing mission to provide you with exceptional heart care, we have created designated Provider Care Teams.  These Care Teams include your primary Cardiologist (physician) and Advanced Practice Providers (APPs -  Physician Assistants and Nurse Practitioners) who all work together to provide you with the care you need, when you need it.  We recommend signing up for the patient portal called "MyChart".  Sign up information is provided on this After Visit Summary.  MyChart is used to connect with patients for Virtual Visits (Telemedicine).  Patients are able to view lab/test results, encounter notes, upcoming appointments, etc.  Non-urgent messages can be sent to your provider as well.   To learn more about what you can do with MyChart, go to [email or web address].    Your next appointment:   4 month(s)  The format for your next appointment:   In Person  Provider:   Norman Herrlich, MD    Important Information About Sugar         Signed, Manson Passey, [place]  12/06/2021 3:12 PM    [facility]

## 2021-12-06 NOTE — Patient Instructions (Signed)
Medication Instructions:   Your physician recommends that you continue on your current medications as directed. Please refer to the Current Medication list given to you today.   *If you need a refill on your cardiac medications before your next appointment, please call your pharmacy*   Lab Work:  None ordered.  If you have labs (blood work) drawn today and your tests are completely normal, you will receive your results only by: MyChart Message (if you have MyChart) OR A paper copy in the mail If you have any lab test that is abnormal or we need to change your treatment, we will call you to review the results.   Testing/Procedures:  None ordered.   Follow-Up: At [facility], you and your health needs are our priority.  As part of our continuing mission to provide you with exceptional heart care, we have created designated Provider Care Teams.  These Care Teams include your primary Cardiologist (physician) and Advanced Practice Providers (APPs -  Physician Assistants and Nurse Practitioners) who all work together to provide you with the care you need, when you need it.  We recommend signing up for the patient portal called "MyChart".  Sign up information is provided on this After Visit Summary.  MyChart is used to connect with patients for Virtual Visits (Telemedicine).  Patients are able to view lab/test results, encounter notes, upcoming appointments, etc.  Non-urgent messages can be sent to your provider as well.   To learn more about what you can do with MyChart, go to [email or web address].    Your next appointment:   4 month(s)  The format for your next appointment:   In Person  Provider:   Norman Herrlich, MD    Important Information About Sugar

## 2022-03-08 ENCOUNTER — Other Ambulatory Visit: Payer: Self-pay | Admitting: Cardiology

## 2022-04-07 NOTE — Progress Notes (Deleted)
Cardiology Office Note:    Date:  04/07/2022   ID:  [name], DOB 29-Jun-1955, MRN [id number]  PCP:  Leonides Sake, MD  Cardiologist:  Shirlee More, MD    Referring MD: Leonides Sake, MD    ASSESSMENT:    No diagnosis found. PLAN:    In order of problems listed above:  ***   Next appointment: ***   Medication Adjustments/Labs and Tests Ordered: Current medicines are reviewed at length with the patient today.  Concerns regarding medicines are outlined above.  No orders of the defined types were placed in this encounter.  No orders of the defined types were placed in this encounter.   No chief complaint on file.   History of Present Illness:    [name] is a 67 y.o. female with a hx of elevated coronary artery calcium score CAD by CTA with moderate stenosis of the LAD 01/03/2021 hypertension and hyperlipidemia last seen in the [place] office of heart care 12/06/2021. Compliance with diet, lifestyle and medications: *** Past Medical History:  Diagnosis Date   Allergy    sinus problems   Bilateral radial fractures    Dr. Adin Hector is managing   CAD (coronary artery disease)    Chronic respiratory failure with hypoxia (HCC)    Colonic ischemia ([place]) 03/21/2013   COPD (chronic obstructive pulmonary disease) ([place])    Depression with anxiety    Emphysema of lung (HCC)    GERD (gastroesophageal reflux disease)    GI bleeding    Heart murmur    Dr. Bettina Gavia   Hyperlipidemia    Hypertension, essential, benign    Insomnia    Lumbar radicular pain    Obesity    Osteoarthritis of right hip    Osteoporosis    Pneumonia    hx of pneumonia after surgery   PONV (postoperative nausea and vomiting)    Sleep apnea    CPAP   Spondylolisthesis of lumbar region [id number]   Umbilical hernia    Vitamin D deficiency     Past Surgical History:  Procedure Laterality Date   ABDOMINAL HYSTERECTOMY     BACK SURGERY     c-3 and c-4   CHOLECYSTECTOMY      coloctomy     5' of intesting removed   COLONOSCOPY     ESOPHAGOGASTRODUODENOSCOPY      Current Medications: No outpatient medications have been marked as taking for the 04/09/22 encounter (Appointment) with Richardo Priest, MD.     Allergies:   Demerol [meperidine], Altace [ramipril], and Iodinated contrast media   Social History   Socioeconomic History   Marital status: Married    Spouse name: Not on file   Number of children: Not on file   Years of education: Not on file   Highest education level: Not on file  Occupational History   Not on file  Tobacco Use   Smoking status: Former    Types: Cigarettes    Quit date: 07/2018    Years since quitting: 3.6    Passive exposure: Past   Smokeless tobacco: Never  Vaping Use   Vaping Use: Former  Substance and Sexual Activity   Alcohol use: No   Drug use: No   Sexual activity: Not on file  Other Topics Concern   Not on file  Social History Narrative   Not on file   Social Determinants of Health   Financial Resource Strain: Not on file  Food Insecurity: Not on  file  Transportation Needs: Not on file  Physical Activity: Not on file  Stress: Not on file  Social Connections: Not on file     Family History: The patient's ***family history includes Colon cancer in her paternal grandfather; Diabetes in her father; Hypertension in her brother and father; Lupus in her mother; Stroke in her paternal grandfather. ROS:   Please see the history of present illness.    All other systems reviewed and are negative.  EKGs/Labs/Other Studies Reviewed:    The following studies were reviewed today:  Cardiac Studies & Procedures       ECHOCARDIOGRAM  ECHOCARDIOGRAM COMPLETE 09/02/2021  Narrative ECHOCARDIOGRAM REPORT    Patient Name:   [name] Date of Exam: 09/02/2021 Medical Rec #:  [id number]           Height:       64.0 in Accession #:    [id number]          Weight:       188.3 lb Date of Birth:  10-30-1955            BSA:          1.907 m Patient Age:    49 years            BP:           139/99 mmHg Patient Gender: F                   HR:           61 bpm. Exam Location:  Inpatient  Procedure: 2D Echo, Cardiac Doppler and Color Doppler  Indications:    Chest Pain  History:        Patient has no prior history of Echocardiogram examinations. CAD, COPD; Risk Factors:Dyslipidemia, Sleep Apnea and Hypertension.  Sonographer:    Jefferey Pica Referring Phys: 52 RHONDA G BARRETT   Sonographer Comments: Image acquisition challenging due to respiratory motion. IMPRESSIONS   1. Left ventricular ejection fraction, by estimation, is 60 to 65%. The left ventricle has normal function. The left ventricle has no regional wall motion abnormalities. There is mild concentric left ventricular hypertrophy. Left ventricular diastolic parameters are consistent with Grade I diastolic dysfunction (impaired relaxation). 2. Right ventricular systolic function is normal. The right ventricular size is normal. 3. The mitral valve is normal in structure. Trivial mitral valve regurgitation. No evidence of mitral stenosis. 4. The aortic valve is tricuspid. Aortic valve regurgitation is trivial. No aortic stenosis is present. 5. The inferior vena cava is normal in size with greater than 50% respiratory variability, suggesting right atrial pressure of 3 mmHg.  Comparison(s): No prior Echocardiogram.  FINDINGS Left Ventricle: Left ventricular ejection fraction, by estimation, is 60 to 65%. The left ventricle has normal function. The left ventricle has no regional wall motion abnormalities. The left ventricular internal cavity size was normal in size. There is mild concentric left ventricular hypertrophy. Left ventricular diastolic parameters are consistent with Grade I diastolic dysfunction (impaired relaxation).  Right Ventricle: The right ventricular size is normal. Right ventricular systolic function is  normal.  Left Atrium: Left atrial size was normal in size.  Right Atrium: Right atrial size was normal in size.  Pericardium: There is no evidence of pericardial effusion.  Mitral Valve: The mitral valve is normal in structure. Trivial mitral valve regurgitation. No evidence of mitral valve stenosis.  Tricuspid Valve: The tricuspid valve is normal in structure. Tricuspid valve regurgitation is trivial. No evidence  of tricuspid stenosis.  Aortic Valve: The aortic valve is tricuspid. Aortic valve regurgitation is trivial. No aortic stenosis is present. Aortic valve peak gradient measures 7.0 mmHg.  Pulmonic Valve: The pulmonic valve was not well visualized. Pulmonic valve regurgitation is not visualized. No evidence of pulmonic stenosis.  Aorta: The aortic root is normal in size and structure.  Venous: The inferior vena cava is normal in size with greater than 50% respiratory variability, suggesting right atrial pressure of 3 mmHg.  IAS/Shunts: No atrial level shunt detected by color flow Doppler.   LEFT VENTRICLE PLAX 2D LVIDd:         4.40 cm   Diastology LVIDs:         2.70 cm   LV e' medial:    3.49 cm/s LV PW:         1.20 cm   LV E/e' medial:  9.2 LV IVS:        1.40 cm   LV e' lateral:   5.27 cm/s LVOT diam:     1.90 cm   LV E/e' lateral: 6.1 LV SV:         82 LV SV Index:   43 LVOT Area:     2.84 cm   RIGHT VENTRICLE             IVC RV Basal diam:  2.70 cm     IVC diam: 1.90 cm RV S prime:     10.70 cm/s TAPSE (M-mode): 1.8 cm  LEFT ATRIUM             Index        RIGHT ATRIUM           Index LA diam:        4.00 cm 2.10 cm/m   RA Area:     17.10 cm LA Vol (A2C):   48.7 ml 25.54 ml/m  RA Volume:   47.00 ml  24.65 ml/m LA Vol (A4C):   53.1 ml 27.85 ml/m LA Biplane Vol: 51.7 ml 27.11 ml/m AORTIC VALVE                 PULMONIC VALVE AV Area (Vmax): 2.76 cm     PV Vmax:       0.78 m/s AV Vmax:        132.50 cm/s  PV Peak grad:  2.5 mmHg AV Peak Grad:   7.0  mmHg LVOT Vmax:      129.00 cm/s LVOT Vmean:     77.700 cm/s LVOT VTI:       0.288 m  AORTA Ao Root diam: 3.40 cm Ao Asc diam:  3.30 cm  MITRAL VALVE MV Area (PHT): 3.26 cm    SHUNTS MV Decel Time: 233 msec    Systemic VTI:  0.29 m MV E velocity: 32.10 cm/s  Systemic Diam: 1.90 cm MV A velocity: 60.00 cm/s MV E/A ratio:  0.54  Kirk Ruths MD Electronically signed by Kirk Ruths MD Signature Date/Time: 09/02/2021/1:19:50 PM    Final     CT SCANS  CT CORONARY MORPH W/CTA COR W/SCORE 01/03/2021  Addendum 01/03/2021 12:47 PM ADDENDUM REPORT: 01/03/2021 12:45  CLINICAL DATA:  Chest pain  EXAM: Cardiac/Coronary  CTA  TECHNIQUE: The patient was scanned on a [employer].  FINDINGS: A 120 kV prospective scan was triggered in the descending thoracic aorta at 111 HU's. Axial non-contrast 3 mm slices were carried out through the heart. The data set was analyzed on a dedicated  work station and scored using the [employer]. Gantry rotation speed was 250 msecs and collimation was .6 mm. No beta blockade and 0.8 mg of sl NTG was given. The 3D data set was reconstructed in 5% intervals of the 67-82 % of the R-R cycle. Diastolic phases were analyzed on a dedicated work station using MPR, MIP and VRT modes. The patient received 80 cc of contrast.  Aorta: Normal size. Moderate calcifications in ascending and descending aorta. No dissection.  Aortic Valve:  Trileaflet.  No calcifications.  Coronary Arteries:  Normal coronary origin.  Right dominance.  RCA is a large dominant artery that gives rise to PDA and PLA. There are multiple small, calcified, non-obstructive (0-25% stenosis) plaques noted in proximal, mid and distal portion of this artery.  Left main is a large artery that gives rise to LAD and LCX arteries. There is minimal, calcified plaque (0-25%) stenosis at the ostium and mid portion of this artery.  LAD is a large vessel that has multiple  calcified, moderate plaques (50-75% stenosis) plaques in its pr mid portion. Proximal and distal portion of this artery has minimal, non-obstructive, calcified (0-25% stenosis) plaques. This artery gives rise to small D1 and small D2.  LCX is a non-dominant artery that gives rise to moderate size OM1 and large OM2 branch. LCX is diffusely diseased with minimal, non-obstructive calcified plaques of 0-25% stenosis.  Other findings:  Normal pulmonary vein drainage into the left atrium.  Normal left atrial appendage without a thrombus.  Normal size of the pulmonary artery.  IMPRESSION: 1. Coronary calcium score of 688. This was 56 percentile for age and sex matched control.  2. Normal coronary origin with right dominance.  3. CAD-RADS 3. Moderate stenosis (mid LAD). Consider symptom-guided anti-ischemic pharmacotherapy as well as risk factor modification per guideline directed care. Additional analysis with CT FFR will be submitted.  4. Pictures will be submitted for FFR analysis of mid LAD stenosis.  Park Liter, MD   Electronically Signed By: Jenne Campus M.D. On: 01/03/2021 12:45  Narrative EXAM: OVER-READ INTERPRETATION  CT CHEST  The following report is an over-read performed by radiologist Dr. Vinnie Langton of [facility], [place] on 01/02/2021. This over-read does not include interpretation of cardiac or coronary anatomy or pathology. The coronary calcium score/coronary CTA interpretation by the cardiologist is attached.  COMPARISON:  None.  FINDINGS: Atherosclerotic calcifications throughout the thoracic aorta. Within the visualized portions of the thorax there are no suspicious appearing pulmonary nodules or masses, there is no acute consolidative airspace disease, no pleural effusions, no pneumothorax and no lymphadenopathy. Visualized portions of the upper abdomen demonstrates diffuse low attenuation throughout the visualized hepatic  parenchyma, indicative of hepatic steatosis. There are no aggressive appearing lytic or blastic lesions noted in the visualized portions of the skeleton.  IMPRESSION: 1.  Aortic Atherosclerosis (ICD10-I70.0). 2. Hepatic steatosis.  Electronically Signed: By: Vinnie Langton M.D. On: 01/02/2021 16:01          EKG:  EKG ordered today and personally reviewed.  The ekg ordered today demonstrates ***  Recent Labs: 09/01/2021: Magnesium 2.0 09/02/2021: TSH 1.264 11/30/2021: BUN 12; Creatinine, Ser 0.87; Hemoglobin 17.7; Platelets 347; Potassium 4.0; Sodium 144  Recent Lipid Panel    Component Value Date/Time   CHOL 161 09/02/2021 0125   TRIG 113 09/02/2021 0125   HDL 34 (L) 09/02/2021 0125   CHOLHDL 4.7 09/02/2021 0125   VLDL 23 09/02/2021 0125   LDLCALC 104 (H) 09/02/2021 0125  Physical Exam:    VS:  There were no vitals taken for this visit.    Wt Readings from Last 3 Encounters:  12/06/21 194 lb (88 kg)  11/30/21 188 lb (85.3 kg)  09/02/21 188 lb 4.8 oz (85.4 kg)     GEN: *** Well nourished, well developed in no acute distress HEENT: Normal NECK: No JVD; No carotid bruits LYMPHATICS: No lymphadenopathy CARDIAC: ***RRR, no murmurs, rubs, gallops RESPIRATORY:  Clear to auscultation without rales, wheezing or rhonchi  ABDOMEN: Soft, non-tender, non-distended MUSCULOSKELETAL:  No edema; No deformity  SKIN: Warm and dry NEUROLOGIC:  Alert and oriented x 3 PSYCHIATRIC:  Normal affect    Signed, Shirlee More, MD  04/07/2022 12:36 PM    [facility]

## 2022-04-09 ENCOUNTER — Ambulatory Visit: Payer: Medicare Other | Attending: Cardiology | Admitting: Cardiology

## 2022-04-09 ENCOUNTER — Other Ambulatory Visit: Payer: Self-pay | Admitting: Cardiology

## 2022-04-09 NOTE — Telephone Encounter (Signed)
Refill to pharmacy, patients needs appt for for future refill 1st attempt

## 2022-04-10 ENCOUNTER — Encounter: Payer: Self-pay | Admitting: Cardiology

## 2022-05-03 ENCOUNTER — Other Ambulatory Visit: Payer: Self-pay | Admitting: Cardiology

## 2022-05-04 NOTE — Telephone Encounter (Signed)
Refill to pharmacy 

## 2022-06-10 ENCOUNTER — Other Ambulatory Visit: Payer: Self-pay | Admitting: Cardiology

## 2022-06-20 ENCOUNTER — Other Ambulatory Visit: Payer: Self-pay

## 2022-06-26 NOTE — Progress Notes (Unsigned)
Cardiology Office Note:    Date:  06/27/2022   ID:  [name], DOB 1955-06-24, MRN [id number]  PCP:  Ailene Ravel, MD  Cardiologist:  Norman Herrlich, MD    Referring MD: Ailene Ravel, MD    ASSESSMENT:    1. Hypertensive heart disease with chronic diastolic congestive heart failure (HCC)   2. Coronary artery disease of native artery of native heart with stable angina pectoris (HCC)   3. Hyperlipidemia LDL goal <70   4. Agatston coronary artery calcium score greater than 400    PLAN:    In order of problems listed above:  I think she is symptomatic hypotensive as a combination of taking multiple quick acting antihypertensive in the morning ARB carvedilol clonidine will attenuate her medications to split times trend blood pressures 2 weeks and make a decision at that point of the chronic antihypertensive regimen. Failure is well compensated no edema continue on minimal diuretic Stable CAD not having angina no evidence of ischemia on her EKG continue aspirin reduced beta-blocker or calcium channel blocker and lipid-lowering with high intensity statin Referred to social services she cannot afford her medications  Next appointment: 6 months   Medication Adjustments/Labs and Tests Ordered: Current medicines are reviewed at length with the patient today.  Concerns regarding medicines are outlined above.  No orders of the defined types were placed in this encounter.  No orders of the defined types were placed in this encounter.  Chief complaint at times of feeling badly from taking all my blood pressure medicines at 1 time and I cannot afford my bronchodilators   History of Present Illness:    [name] is a 67 y.o. female with a hx of hypertension stage III CKD hyperlipidemia elevated coronary calcium score 688/96 percentile moderate stenosis of mid LAD with normal FFR last seen 05/12/2021.  Compliance with diet, lifestyle and medications: No she is  having trouble affording her bronchodilators and is more short of breath  At is at home she gets weak nauseous I think is from taking multiple quick-acting antihypertensives at the same time including carvedilol and clonidine In the office she is hypotensive Placed her supine she feels better last blood pressure 96/60 and her EKG shows sinus bradycardia 51 bpm no ischemic changes I will attenuate her antihypertensives taking clonidine only in the evening carvedilol morning and I will have her split the time of carvedilol 1 hour from her morning medications.  Will also place her ARB on hold and trend blood pressures for 2 weeks Has had no edema chest pain palpitation or syncope Past Medical History:  Diagnosis Date   Allergy    sinus problems   Bilateral radial fractures    Dr. Mardene Speak is managing   CAD (coronary artery disease)    Chest pain 09/01/2021   Chronic respiratory failure with hypoxia (HCC)    Colonic ischemia (HCC) 03/21/2013   COPD (chronic obstructive pulmonary disease) (HCC)    Depression with anxiety    Dysuria 04/25/2021   Emphysema of lung (HCC)    GERD (gastroesophageal reflux disease)    GI bleeding    Heart murmur    Dr. Dulce Sellar   Hyperlipidemia    Hypertension, essential, benign    Insomnia    Lumbar radicular pain    Obesity    Osteoarthritis of right hip    Osteoporosis    Pneumonia    hx of pneumonia after surgery   PONV (postoperative nausea and vomiting)  Prolonged QT interval 09/01/2021   Retention of urine 04/25/2021   Sleep apnea    CPAP   Spondylolisthesis of lumbar region 12/17/2016   Umbilical hernia    Vitamin D deficiency     Past Surgical History:  Procedure Laterality Date   ABDOMINAL HYSTERECTOMY     BACK SURGERY     c-3 and c-4   CHOLECYSTECTOMY     coloctomy     5' of intesting removed   COLONOSCOPY     ESOPHAGOGASTRODUODENOSCOPY      Current Medications: Current Meds  Medication Sig   albuterol (PROVENTIL) (2.5 MG/3ML)  0.083% nebulizer solution Take 2.5 mg by nebulization every 6 (six) hours as needed for wheezing.   Ascorbic Acid (VITAMIN C) 1000 MG tablet Take 1,000 mg by mouth daily.   aspirin EC 81 MG tablet Take 81 mg by mouth daily. Swallow whole.   Baclofen 5 MG TABS Take 1 tablet by mouth 3 (three) times daily as needed (spasms).   denosumab (PROLIA) 60 MG/ML SOSY injection Inject 60 mg into the skin every 6 (six) months.   diltiazem (CARDIZEM) 120 MG tablet Take 120 mg by mouth 2 (two) times daily.   docusate sodium (COLACE) 100 MG capsule Take 1 capsule (100 mg total) 2 (two) times daily by mouth.   ezetimibe (ZETIA) 10 MG tablet Take 1 tablet (10 mg total) by mouth daily.   famotidine (PEPCID) 40 MG tablet Take 40 mg by mouth daily.   furosemide (LASIX) 20 MG tablet Take 20 mg by mouth every morning.   gabapentin (NEURONTIN) 600 MG tablet Take 600 mg by mouth 3 (three) times daily.   HYDROcodone-acetaminophen (NORCO) 7.5-325 MG tablet Take 1-2 tablets by mouth 4 (four) times daily as needed for moderate pain.   isosorbide mononitrate (IMDUR) 30 MG 24 hr tablet Take 1 tablet (30 mg total) by mouth daily.   Magnesium 200 MG TABS Take 200 mg by mouth daily.   nitroGLYCERIN (NITROSTAT) 0.4 MG SL tablet Place 1 tablet (0.4 mg total) under the tongue every 5 (five) minutes as needed for chest pain.   potassium chloride (MICRO-K) 10 MEQ CR capsule Take 10 mEq by mouth every morning.   rosuvastatin (CRESTOR) 10 MG tablet Take 10 mg by mouth daily.   sertraline (ZOLOFT) 50 MG tablet Take 50 mg by mouth daily.   tiZANidine (ZANAFLEX) 4 MG tablet Take 4 mg by mouth 3 (three) times daily as needed for muscle spasms.   valsartan (DIOVAN) 80 MG tablet TAKE 1 TABLET BY MOUTH EVERY DAY   Vitamin D, Ergocalciferol, (DRISDOL) 1.25 MG (50000 UNIT) CAPS capsule Take 50,000 Units by mouth every 7 (seven) days. Thursday   vitamin E 180 MG (400 UNITS) capsule Take 400 Units by mouth daily.   zolpidem (AMBIEN) 5 MG tablet  Take 5 mg by mouth at bedtime as needed for sleep.   [DISCONTINUED] carvedilol (COREG) 6.25 MG tablet Take 1 tablet (6.25 mg total) by mouth 2 (two) times daily. Needs appointment for future refills   [DISCONTINUED] cloNIDine (CATAPRES) 0.1 MG tablet Take 0.1 mg by mouth 2 (two) times daily.   [DISCONTINUED] losartan (COZAAR) 50 MG tablet Take 50 mg by mouth daily.     Allergies:   Demerol [meperidine], Altace [ramipril], and Iodinated contrast media   Social History   Socioeconomic History   Marital status: Married    Spouse name: Not on file   Number of children: Not on file   Years of education: Not  on file   Highest education level: Not on file  Occupational History   Not on file  Tobacco Use   Smoking status: Former    Types: Cigarettes    Quit date: 07/2018    Years since quitting: 3.9    Passive exposure: Past   Smokeless tobacco: Never  Vaping Use   Vaping Use: Former  Substance and Sexual Activity   Alcohol use: No   Drug use: No   Sexual activity: Not on file  Other Topics Concern   Not on file  Social History Narrative   Not on file   Social Determinants of Health   Financial Resource Strain: Not on file  Food Insecurity: Not on file  Transportation Needs: Not on file  Physical Activity: Not on file  Stress: Not on file  Social Connections: Not on file     Family History: The patient's family history includes Colon cancer in her paternal grandfather; Diabetes in her father; Hypertension in her brother and father; Lupus in her mother; Stroke in her paternal grandfather. ROS:   Please see the history of present illness.    All other systems reviewed and are negative.  EKGs/Labs/Other Studies Reviewed:    The following studies were reviewed today:  Cardiac Studies & Procedures       ECHOCARDIOGRAM  ECHOCARDIOGRAM COMPLETE 09/02/2021  Narrative ECHOCARDIOGRAM REPORT    Patient Name:   YASHEKA HALLERAN Date of Exam: 09/02/2021 Medical Rec #:   [id number]           Height:       64.0 in Accession #:    [id number]          Weight:       188.3 lb Date of Birth:  04-18-1955           BSA:          1.907 m Patient Age:    66 years            BP:           139/99 mmHg Patient Gender: F                   HR:           61 bpm. Exam Location:  Inpatient  Procedure: 2D Echo, Cardiac Doppler and Color Doppler  Indications:    Chest Pain  History:        Patient has no prior history of Echocardiogram examinations. CAD, COPD; Risk Factors:Dyslipidemia, Sleep Apnea and Hypertension.  Sonographer:    Eduard Roux Referring Phys: 7 RHONDA G BARRETT   Sonographer Comments: Image acquisition challenging due to respiratory motion. IMPRESSIONS   1. Left ventricular ejection fraction, by estimation, is 60 to 65%. The left ventricle has normal function. The left ventricle has no regional wall motion abnormalities. There is mild concentric left ventricular hypertrophy. Left ventricular diastolic parameters are consistent with Grade I diastolic dysfunction (impaired relaxation). 2. Right ventricular systolic function is normal. The right ventricular size is normal. 3. The mitral valve is normal in structure. Trivial mitral valve regurgitation. No evidence of mitral stenosis. 4. The aortic valve is tricuspid. Aortic valve regurgitation is trivial. No aortic stenosis is present. 5. The inferior vena cava is normal in size with greater than 50% respiratory variability, suggesting right atrial pressure of 3 mmHg.  Comparison(s): No prior Echocardiogram.  FINDINGS Left Ventricle: Left ventricular ejection fraction, by estimation, is 60 to 65%. The left ventricle has normal  function. The left ventricle has no regional wall motion abnormalities. The left ventricular internal cavity size was normal in size. There is mild concentric left ventricular hypertrophy. Left ventricular diastolic parameters are consistent with Grade I diastolic dysfunction  (impaired relaxation).  Right Ventricle: The right ventricular size is normal. Right ventricular systolic function is normal.  Left Atrium: Left atrial size was normal in size.  Right Atrium: Right atrial size was normal in size.  Pericardium: There is no evidence of pericardial effusion.  Mitral Valve: The mitral valve is normal in structure. Trivial mitral valve regurgitation. No evidence of mitral valve stenosis.  Tricuspid Valve: The tricuspid valve is normal in structure. Tricuspid valve regurgitation is trivial. No evidence of tricuspid stenosis.  Aortic Valve: The aortic valve is tricuspid. Aortic valve regurgitation is trivial. No aortic stenosis is present. Aortic valve peak gradient measures 7.0 mmHg.  Pulmonic Valve: The pulmonic valve was not well visualized. Pulmonic valve regurgitation is not visualized. No evidence of pulmonic stenosis.  Aorta: The aortic root is normal in size and structure.  Venous: The inferior vena cava is normal in size with greater than 50% respiratory variability, suggesting right atrial pressure of 3 mmHg.  IAS/Shunts: No atrial level shunt detected by color flow Doppler.   LEFT VENTRICLE PLAX 2D LVIDd:         4.40 cm   Diastology LVIDs:         2.70 cm   LV e' medial:    3.49 cm/s LV PW:         1.20 cm   LV E/e' medial:  9.2 LV IVS:        1.40 cm   LV e' lateral:   5.27 cm/s LVOT diam:     1.90 cm   LV E/e' lateral: 6.1 LV SV:         82 LV SV Index:   43 LVOT Area:     2.84 cm   RIGHT VENTRICLE             IVC RV Basal diam:  2.70 cm     IVC diam: 1.90 cm RV S prime:     10.70 cm/s TAPSE (M-mode): 1.8 cm  LEFT ATRIUM             Index        RIGHT ATRIUM           Index LA diam:        4.00 cm 2.10 cm/m   RA Area:     17.10 cm LA Vol (A2C):   48.7 ml 25.54 ml/m  RA Volume:   47.00 ml  24.65 ml/m LA Vol (A4C):   53.1 ml 27.85 ml/m LA Biplane Vol: 51.7 ml 27.11 ml/m AORTIC VALVE                 PULMONIC VALVE AV Area  (Vmax): 2.76 cm     PV Vmax:       0.78 m/s AV Vmax:        132.50 cm/s  PV Peak grad:  2.5 mmHg AV Peak Grad:   7.0 mmHg LVOT Vmax:      129.00 cm/s LVOT Vmean:     77.700 cm/s LVOT VTI:       0.288 m  AORTA Ao Root diam: 3.40 cm Ao Asc diam:  3.30 cm  MITRAL VALVE MV Area (PHT): 3.26 cm    SHUNTS MV Decel Time: 233 msec    Systemic VTI:  0.29 m MV E velocity: 32.10 cm/s  Systemic Diam: 1.90 cm MV A velocity: 60.00 cm/s MV E/A ratio:  0.54  Olga Millers MD Electronically signed by Olga Millers MD Signature Date/Time: 09/02/2021/1:19:50 PM    Final     CT SCANS  CT CORONARY MORPH W/CTA COR W/SCORE 01/03/2021  Addendum 01/03/2021 12:47 PM ADDENDUM REPORT: 01/03/2021 12:45  CLINICAL DATA:  Chest pain  EXAM: Cardiac/Coronary  CTA  TECHNIQUE: The patient was scanned on a [employer].  FINDINGS: A 120 kV prospective scan was triggered in the descending thoracic aorta at 111 HU's. Axial non-contrast 3 mm slices were carried out through the heart. The data set was analyzed on a dedicated work station and scored using the Agatson method. Gantry rotation speed was 250 msecs and collimation was .6 mm. No beta blockade and 0.8 mg of sl NTG was given. The 3D data set was reconstructed in 5% intervals of the 67-82 % of the R-R cycle. Diastolic phases were analyzed on a dedicated work station using MPR, MIP and VRT modes. The patient received 80 cc of contrast.  Aorta: Normal size. Moderate calcifications in ascending and descending aorta. No dissection.  Aortic Valve:  Trileaflet.  No calcifications.  Coronary Arteries:  Normal coronary origin.  Right dominance.  RCA is a large dominant artery that gives rise to PDA and PLA. There are multiple small, calcified, non-obstructive (0-25% stenosis) plaques noted in proximal, mid and distal portion of this artery.  Left main is a large artery that gives rise to LAD and LCX arteries. There is minimal,  calcified plaque (0-25%) stenosis at the ostium and mid portion of this artery.  LAD is a large vessel that has multiple calcified, moderate plaques (50-75% stenosis) plaques in its pr mid portion. Proximal and distal portion of this artery has minimal, non-obstructive, calcified (0-25% stenosis) plaques. This artery gives rise to small D1 and small D2.  LCX is a non-dominant artery that gives rise to moderate size OM1 and large OM2 branch. LCX is diffusely diseased with minimal, non-obstructive calcified plaques of 0-25% stenosis.  Other findings:  Normal pulmonary vein drainage into the left atrium.  Normal left atrial appendage without a thrombus.  Normal size of the pulmonary artery.  IMPRESSION: 1. Coronary calcium score of 688. This was 82 percentile for age and sex matched control.  2. Normal coronary origin with right dominance.  3. CAD-RADS 3. Moderate stenosis (mid LAD). Consider symptom-guided anti-ischemic pharmacotherapy as well as risk factor modification per guideline directed care. Additional analysis with CT FFR will be submitted.  4. Pictures will be submitted for FFR analysis of mid LAD stenosis.  Georgeanna Lea, MD   Electronically Signed By: Gypsy Balsam M.D. On: 01/03/2021 12:45  Narrative EXAM: OVER-READ INTERPRETATION  CT CHEST  The following report is an over-read performed by radiologist Dr. Trudie Reed of [facility] on 01/02/2021. This over-read does not include interpretation of cardiac or coronary anatomy or pathology. The coronary calcium score/coronary CTA interpretation by the cardiologist is attached.  COMPARISON:  None.  FINDINGS: Atherosclerotic calcifications throughout the thoracic aorta. Within the visualized portions of the thorax there are no suspicious appearing pulmonary nodules or masses, there is no acute consolidative airspace disease, no pleural effusions, no pneumothorax and no  lymphadenopathy. Visualized portions of the upper abdomen demonstrates diffuse low attenuation throughout the visualized hepatic parenchyma, indicative of hepatic steatosis. There are no aggressive appearing lytic or blastic lesions noted in the visualized portions  of the skeleton.  IMPRESSION: 1.  Aortic Atherosclerosis (ICD10-I70.0). 2. Hepatic steatosis.  Electronically Signed: By: Trudie Reed M.D. On: 01/02/2021 16:01          EKG:  EKG ordered today and personally reviewed.  The ekg ordered today demonstrates sinus bradycardia 51 bpm otherwise normal EKG  Recent Labs: 09/01/2021: Magnesium 2.0 09/02/2021: TSH 1.264 11/30/2021: BUN 12; Creatinine, Ser 0.87; Hemoglobin 17.7; Platelets 347; Potassium 4.0; Sodium 144  Recent Lipid Panel    Component Value Date/Time   CHOL 161 09/02/2021 0125   TRIG 113 09/02/2021 0125   HDL 34 (L) 09/02/2021 0125   CHOLHDL 4.7 09/02/2021 0125   VLDL 23 09/02/2021 0125   LDLCALC 104 (H) 09/02/2021 0125    Physical Exam:    VS:  BP 96/60   Pulse (!) 56   Ht 5\' 4"  (1.626 m)   Wt 208 lb 6.4 oz (94.5 kg)   SpO2 93%   BMI 35.77 kg/m     Wt Readings from Last 3 Encounters:  06/27/22 208 lb 6.4 oz (94.5 kg)  12/06/21 194 lb (88 kg)  11/30/21 188 lb (85.3 kg)     GEN: She appeared pale initially supine and blood pressure improved she looks much better well nourished, well developed in no acute distress HEENT: Normal NECK: No JVD; No carotid bruits LYMPHATICS: No lymphadenopathy CARDIAC: RRR, no murmurs, rubs, gallops RESPIRATORY:  Clear to auscultation without rales, wheezing or rhonchi  ABDOMEN: Soft, non-tender, non-distended MUSCULOSKELETAL:  No edema; No deformity  SKIN: Warm and dry NEUROLOGIC:  Alert and oriented x 3 PSYCHIATRIC:  Normal affect    Signed, Norman Herrlich, MD  06/27/2022 2:48 PM    [facility]

## 2022-06-27 ENCOUNTER — Ambulatory Visit: Payer: Medicare Other | Attending: Cardiology | Admitting: Cardiology

## 2022-06-27 ENCOUNTER — Encounter: Payer: Self-pay | Admitting: Cardiology

## 2022-06-27 VITALS — BP 88/52 | HR 56 | Ht 64.0 in | Wt 208.4 lb

## 2022-06-27 DIAGNOSIS — R931 Abnormal findings on diagnostic imaging of heart and coronary circulation: Secondary | ICD-10-CM | POA: Diagnosis present

## 2022-06-27 DIAGNOSIS — E785 Hyperlipidemia, unspecified: Secondary | ICD-10-CM | POA: Diagnosis present

## 2022-06-27 DIAGNOSIS — I110 Hypertensive heart disease with heart failure: Secondary | ICD-10-CM | POA: Insufficient documentation

## 2022-06-27 DIAGNOSIS — I5032 Chronic diastolic (congestive) heart failure: Secondary | ICD-10-CM | POA: Insufficient documentation

## 2022-06-27 DIAGNOSIS — I25118 Atherosclerotic heart disease of native coronary artery with other forms of angina pectoris: Secondary | ICD-10-CM | POA: Insufficient documentation

## 2022-06-27 MED ORDER — CARVEDILOL 6.25 MG PO TABS: 6.2500 mg | ORAL_TABLET | Freq: Every day | ORAL | 3 refills | Status: DC

## 2022-06-27 MED ORDER — CLONIDINE HCL 0.1 MG PO TABS: 0.1000 mg | ORAL_TABLET | Freq: Every day | ORAL | 3 refills | Status: DC

## 2022-06-27 NOTE — Patient Instructions (Signed)
Medication Instructions:  Your physician has recommended you make the following change in your medication:   START: Carvedilol 6.25 mg daily (Please take this medication 1 hour after your other morning medications) START: Clonidine 0.1 mg daily at bedtime. STOP: Losartan STOP: Valsartan  *If you need a refill on your cardiac medications before your next appointment, please call your pharmacy*   Lab Work: None  If you have labs (blood work) drawn today and your tests are completely normal, you will receive your results only by: MyChart Message (if you have MyChart) OR A paper copy in the mail If you have any lab test that is abnormal or we need to change your treatment, we will call you to review the results.   Testing/Procedures: None   Follow-Up: At [facility], you and your health needs are our priority.  As part of our continuing mission to provide you with exceptional heart care, we have created designated Provider Care Teams.  These Care Teams include your primary Cardiologist (physician) and Advanced Practice Providers (APPs -  Physician Assistants and Nurse Practitioners) who all work together to provide you with the care you need, when you need it.  We recommend signing up for the patient portal called "MyChart".  Sign up information is provided on this After Visit Summary.  MyChart is used to connect with patients for Virtual Visits (Telemedicine).  Patients are able to view lab/test results, encounter notes, upcoming appointments, etc.  Non-urgent messages can be sent to your provider as well.   To learn more about what you can do with MyChart, go to [email or web address].    Your next appointment:   6 month(s)  Provider:   Norman Herrlich, MD    Other Instructions Check your blood pressures daily and bring a list of them to the office in 2 weeks.

## 2022-06-28 NOTE — Addendum Note (Signed)
Addended by: Roosvelt Harps R on: 06/28/2022 01:47 PM   Modules accepted: Orders

## 2022-07-09 ENCOUNTER — Other Ambulatory Visit: Payer: Self-pay

## 2022-07-09 ENCOUNTER — Telehealth: Payer: Self-pay | Admitting: Cardiology

## 2022-07-09 NOTE — Telephone Encounter (Signed)
Called [employer] and verified with her that the patient was not currently taking Valsartan or Losartan and she was already taking Carvedilol. [employer] had no further questions at this time.

## 2022-07-09 NOTE — Telephone Encounter (Signed)
Alexis called in asking if pt can stop Valsartan and Losartan and only take Valsartan 160mg . Informed her those medications were discontinued at her last appt and Carvedilol was added. She states pt has still been taking it and would like to clarify with clinical staff. Please advise.   She stated to ask for [employer].

## 2022-07-11 ENCOUNTER — Other Ambulatory Visit: Payer: Self-pay

## 2022-07-11 ENCOUNTER — Telehealth: Payer: Self-pay | Admitting: Cardiology

## 2022-07-11 NOTE — Telephone Encounter (Signed)
Received a called from the patient and she reported that her blood pressure was elevated and she was having blurry vision and a major headache. Her latest blood pressures have been 180/105, 171/108, 175/110, 147/101 and 180/108. I spoke to DR. Bing Matter regarding the patient's high blood pressure and her symptoms and Dr. Bing Matter recommended that she go to the ER to be evaluated. I relayed Dr. Vanetta Shawl recommendation to the patient and she refused to go to the ER. Spoke to Dr. Bing Matter about her refusing to go to the ER and he then recommended that she check her blood pressure in 1 hour and if it was still high like 180/108, then she was to take another clonidine. I relayed this information to the patient and she was agreeable with this plan and had no further questions at this time.

## 2022-07-11 NOTE — Telephone Encounter (Signed)
Pt c/o BP issue: STAT if pt c/o blurred vision, one-sided weakness or slurred speech  1. What are your last 5 BP readings? 180/105; 171/108; 175/110; 147/101  2. Are you having any other symptoms (ex. Dizziness, headache, blurred vision, passed out)? Headache, blurred vision  3. What is your BP issue? Blood pressure is running really high

## 2022-07-31 ENCOUNTER — Other Ambulatory Visit: Payer: Self-pay | Admitting: Cardiology

## 2022-07-31 NOTE — Telephone Encounter (Signed)
Rx refill sent to pharmacy. 

## 2022-08-17 ENCOUNTER — Other Ambulatory Visit: Payer: Self-pay | Admitting: Cardiology

## 2022-08-17 ENCOUNTER — Telehealth: Payer: Self-pay | Admitting: Cardiology

## 2022-08-17 NOTE — Telephone Encounter (Signed)
Spoke to Joe at [employer] [facility] and he stated that he was told by the patient that she was told to take carvedilol (COREG) 6.25 MG tablet twice a day if her blood pressure continued to stay high. Informed Joe that according to an office note dated 07/11/22 Dr. Vanetta Shawl recommendation to the patient and she refused to go to the ER. Spoke to Dr. Bing Matter about her refusing to go to the ER and he then recommended that she check her blood pressure in 1 hour and if it was still high like 180/108, then she was to take another clonidine. I relayed this information to the patient and she was agreeable with this plan and had no further questions at this time. She was not to take another COREG but to take another clonidine. Pharmacy staff stated that they would informed the patient of the Rx.

## 2022-08-17 NOTE — Telephone Encounter (Signed)
Pt c/o medication issue:  1. Name of Medication: carvedilol (COREG) 6.25 MG tablet   2. How are you currently taking this medication (dosage and times per day)?   Take 1 tablet (6.25 mg total) by mouth daily. Please take this medication 1 hour after taking your other morning medication    3. Are you having a reaction (difficulty breathing--STAT)? no  4. What is your medication issue? [facility] calling to verify dosage of medication. Patient states its suppose to be twice and day but pharmacy has prescription for once. If it is two they need a new prescription sent over. Please and advise

## 2022-09-06 NOTE — Progress Notes (Signed)
Cardiology Office Note:  .   Date:  09/07/2022  ID:  [name], DOB 06/10/1955, MRN [id number] PCP: Ailene Ravel, MD  [facility] Providers Cardiologist:  Norman Herrlich, MD    History of Present Illness: .   [name] is a 67 y.o. female with a past medical history of nonobstructive CAD per coronary CTA, hypertension, COPD, OSA, GERD, history of GI bleed, urinary retention, hyperlipidemia.  09/02/2021 echo EF 60 to 65%, mild concentric LVH, grade 1 DD, trivial MR 01/02/2021 coronary CTA calcium score 688, 96 percentile, moderate stenosis of her LAD, FFR demonstrated low probability for hemodynamically significant stenosis  Evaluated by Dr. Dulce Sellar on 06/27/2022, she was hypotensive and her medication regimen was rearranged.  Also some issues with affording her medications and a social work consult was placed.  Recently underwent hernia repair on 08/20/22,  some complications occurred, she developed pneumonia and required admission to ICU for a week.  She presented to the emergency department on 09/02/2022 with worsening abdominal pain and discharge from her surgical site.  CT was obtained showing evidence of cellulitis possibility of an abscess, surgery was consulted and the recommendation was to proceed with oral antibiotics that she was nontoxic-appearing.  She presents today for follow-up of her nonobstructive CAD.  Several of her medications were stopped while she was hospitalized secondary to hypotension however she has resumed most of her medications as they were before.  She is well versed in the management of her hypertension-as she is a retired Engineer, civil (consulting), she staggers her medications throughout the day and her blood pressure readings at home are typically well-controlled.  She was evaluated by her surgeon yesterday following her hernia repair, is doing better from that perspective. She denies chest pain, palpitations, dyspnea, pnd, orthopnea, n, v, dizziness, syncope,  edema, weight gain, or early satiety.   ROS: Review of Systems  Constitutional: Negative.   HENT: Negative.    Eyes: Negative.   Respiratory: Negative.    Cardiovascular: Negative.   Gastrointestinal:  Positive for abdominal pain (s/p open hernia repair).  Musculoskeletal: Negative.   Skin: Negative.   Neurological: Negative.   Endo/Heme/Allergies: Negative.   Psychiatric/Behavioral: Negative.       Studies Reviewed: .        Cardiac Studies & Procedures       ECHOCARDIOGRAM  ECHOCARDIOGRAM COMPLETE 09/02/2021  Narrative ECHOCARDIOGRAM REPORT    Patient Name:   [name] Date of Exam: 09/02/2021 Medical Rec #:  [id number]           Height:       64.0 in Accession #:    [id number]          Weight:       188.3 lb Date of Birth:  October 08, 1955           BSA:          1.907 m Patient Age:    66 years            BP:           139/99 mmHg Patient Gender: F                   HR:           61 bpm. Exam Location:  Inpatient  Procedure: 2D Echo, Cardiac Doppler and Color Doppler  Indications:    Chest Pain  History:        Patient has no prior history of Echocardiogram  examinations. CAD, COPD; Risk Factors:Dyslipidemia, Sleep Apnea and Hypertension.  Sonographer:    Eduard Roux Referring Phys: 33 RHONDA G BARRETT   Sonographer Comments: Image acquisition challenging due to respiratory motion. IMPRESSIONS   1. Left ventricular ejection fraction, by estimation, is 60 to 65%. The left ventricle has normal function. The left ventricle has no regional wall motion abnormalities. There is mild concentric left ventricular hypertrophy. Left ventricular diastolic parameters are consistent with Grade I diastolic dysfunction (impaired relaxation). 2. Right ventricular systolic function is normal. The right ventricular size is normal. 3. The mitral valve is normal in structure. Trivial mitral valve regurgitation. No evidence of mitral stenosis. 4. The aortic valve is  tricuspid. Aortic valve regurgitation is trivial. No aortic stenosis is present. 5. The inferior vena cava is normal in size with greater than 50% respiratory variability, suggesting right atrial pressure of 3 mmHg.  Comparison(s): No prior Echocardiogram.  FINDINGS Left Ventricle: Left ventricular ejection fraction, by estimation, is 60 to 65%. The left ventricle has normal function. The left ventricle has no regional wall motion abnormalities. The left ventricular internal cavity size was normal in size. There is mild concentric left ventricular hypertrophy. Left ventricular diastolic parameters are consistent with Grade I diastolic dysfunction (impaired relaxation).  Right Ventricle: The right ventricular size is normal. Right ventricular systolic function is normal.  Left Atrium: Left atrial size was normal in size.  Right Atrium: Right atrial size was normal in size.  Pericardium: There is no evidence of pericardial effusion.  Mitral Valve: The mitral valve is normal in structure. Trivial mitral valve regurgitation. No evidence of mitral valve stenosis.  Tricuspid Valve: The tricuspid valve is normal in structure. Tricuspid valve regurgitation is trivial. No evidence of tricuspid stenosis.  Aortic Valve: The aortic valve is tricuspid. Aortic valve regurgitation is trivial. No aortic stenosis is present. Aortic valve peak gradient measures 7.0 mmHg.  Pulmonic Valve: The pulmonic valve was not well visualized. Pulmonic valve regurgitation is not visualized. No evidence of pulmonic stenosis.  Aorta: The aortic root is normal in size and structure.  Venous: The inferior vena cava is normal in size with greater than 50% respiratory variability, suggesting right atrial pressure of 3 mmHg.  IAS/Shunts: No atrial level shunt detected by color flow Doppler.   LEFT VENTRICLE PLAX 2D LVIDd:         4.40 cm   Diastology LVIDs:         2.70 cm   LV e' medial:    3.49 cm/s LV PW:          1.20 cm   LV E/e' medial:  9.2 LV IVS:        1.40 cm   LV e' lateral:   5.27 cm/s LVOT diam:     1.90 cm   LV E/e' lateral: 6.1 LV SV:         82 LV SV Index:   43 LVOT Area:     2.84 cm   RIGHT VENTRICLE             IVC RV Basal diam:  2.70 cm     IVC diam: 1.90 cm RV S prime:     10.70 cm/s TAPSE (M-mode): 1.8 cm  LEFT ATRIUM             Index        RIGHT ATRIUM           Index LA diam:        4.00  cm 2.10 cm/m   RA Area:     17.10 cm LA Vol (A2C):   48.7 ml 25.54 ml/m  RA Volume:   47.00 ml  24.65 ml/m LA Vol (A4C):   53.1 ml 27.85 ml/m LA Biplane Vol: 51.7 ml 27.11 ml/m AORTIC VALVE                 PULMONIC VALVE AV Area (Vmax): 2.76 cm     PV Vmax:       0.78 m/s AV Vmax:        132.50 cm/s  PV Peak grad:  2.5 mmHg AV Peak Grad:   7.0 mmHg LVOT Vmax:      129.00 cm/s LVOT Vmean:     77.700 cm/s LVOT VTI:       0.288 m  AORTA Ao Root diam: 3.40 cm Ao Asc diam:  3.30 cm  MITRAL VALVE MV Area (PHT): 3.26 cm    SHUNTS MV Decel Time: 233 msec    Systemic VTI:  0.29 m MV E velocity: 32.10 cm/s  Systemic Diam: 1.90 cm MV A velocity: 60.00 cm/s MV E/A ratio:  0.54  Olga Millers MD Electronically signed by Olga Millers MD Signature Date/Time: 09/02/2021/1:19:50 PM    Final     CT SCANS  CT CORONARY MORPH W/CTA COR W/SCORE 01/02/2021  Addendum 01/03/2021 12:47 PM ADDENDUM REPORT: 01/03/2021 12:45  CLINICAL DATA:  Chest pain  EXAM: Cardiac/Coronary  CTA  TECHNIQUE: The patient was scanned on a [employer].  FINDINGS: A 120 kV prospective scan was triggered in the descending thoracic aorta at 111 HU's. Axial non-contrast 3 mm slices were carried out through the heart. The data set was analyzed on a dedicated work station and scored using the Agatson method. Gantry rotation speed was 250 msecs and collimation was .6 mm. No beta blockade and 0.8 mg of sl NTG was given. The 3D data set was reconstructed in 5% intervals of the 67-82 % of  the R-R cycle. Diastolic phases were analyzed on a dedicated work station using MPR, MIP and VRT modes. The patient received 80 cc of contrast.  Aorta: Normal size. Moderate calcifications in ascending and descending aorta. No dissection.  Aortic Valve:  Trileaflet.  No calcifications.  Coronary Arteries:  Normal coronary origin.  Right dominance.  RCA is a large dominant artery that gives rise to PDA and PLA. There are multiple small, calcified, non-obstructive (0-25% stenosis) plaques noted in proximal, mid and distal portion of this artery.  Left main is a large artery that gives rise to LAD and LCX arteries. There is minimal, calcified plaque (0-25%) stenosis at the ostium and mid portion of this artery.  LAD is a large vessel that has multiple calcified, moderate plaques (50-75% stenosis) plaques in its pr mid portion. Proximal and distal portion of this artery has minimal, non-obstructive, calcified (0-25% stenosis) plaques. This artery gives rise to small D1 and small D2.  LCX is a non-dominant artery that gives rise to moderate size OM1 and large OM2 branch. LCX is diffusely diseased with minimal, non-obstructive calcified plaques of 0-25% stenosis.  Other findings:  Normal pulmonary vein drainage into the left atrium.  Normal left atrial appendage without a thrombus.  Normal size of the pulmonary artery.  IMPRESSION: 1. Coronary calcium score of 688. This was 42 percentile for age and sex matched control.  2. Normal coronary origin with right dominance.  3. CAD-RADS 3. Moderate stenosis (mid LAD). Consider symptom-guided anti-ischemic pharmacotherapy as  well as risk factor modification per guideline directed care. Additional analysis with CT FFR will be submitted.  4. Pictures will be submitted for FFR analysis of mid LAD stenosis.  Georgeanna Lea, MD   Electronically Signed By: Gypsy Balsam M.D. On: 01/03/2021  12:45  Narrative EXAM: OVER-READ INTERPRETATION  CT CHEST  The following report is an over-read performed by radiologist Dr. Trudie Reed of [facility] on 01/02/2021. This over-read does not include interpretation of cardiac or coronary anatomy or pathology. The coronary calcium score/coronary CTA interpretation by the cardiologist is attached.  COMPARISON:  None.  FINDINGS: Atherosclerotic calcifications throughout the thoracic aorta. Within the visualized portions of the thorax there are no suspicious appearing pulmonary nodules or masses, there is no acute consolidative airspace disease, no pleural effusions, no pneumothorax and no lymphadenopathy. Visualized portions of the upper abdomen demonstrates diffuse low attenuation throughout the visualized hepatic parenchyma, indicative of hepatic steatosis. There are no aggressive appearing lytic or blastic lesions noted in the visualized portions of the skeleton.  IMPRESSION: 1.  Aortic Atherosclerosis (ICD10-I70.0). 2. Hepatic steatosis.  Electronically Signed: By: Trudie Reed M.D. On: 01/02/2021 16:01          Risk Assessment/Calculations:             Physical Exam:   VS:  BP 130/78 Comment: home bp reading  Pulse 85   Ht 5\' 4"  (1.626 m)   Wt 204 lb 3.2 oz (92.6 kg)   SpO2 94%   BMI 35.05 kg/m    Wt Readings from Last 3 Encounters:  09/07/22 204 lb 3.2 oz (92.6 kg)  06/27/22 208 lb 6.4 oz (94.5 kg)  12/06/21 194 lb (88 kg)    GEN: Well nourished, well developed in no acute distress NECK: No JVD; No carotid bruits CARDIAC: RRR, no murmurs, rubs, gallops RESPIRATORY:  Clear to auscultation without rales, wheezing or rhonchi  ABDOMEN: Soft, non-tender, non-distended EXTREMITIES:  No edema; No deformity   ASSESSMENT AND PLAN: .   CAD - nonobstructive per cCTA, Stable with no anginal symptoms. No indication for ischemic evaluation.  Continue aspirin 81 mg daily, continue Coreg 6.25 mg twice  daily, continue Zetia 10 mg daily, continue nitroglycerin as needed, continue Crestor 10 mg daily, continue Imdur 30 mg daily. HTN -blood pressure is marginally elevated in the office today however blood pressure readings are typically well-controlled at home.  She staggers her medications throughout the day and checks her blood pressure frequently.  Continue Coreg 6.25 mg twice daily, continue valsartan 80 mg daily, continue clonidine 0.1 mg twice daily, continue diltiazem 120 mg twice daily. HLD -LDL on 02/05/2022 was well-controlled at 69, continue Crestor 10 mg daily, continue Zetia 10 mg daily. Hernia repair-was supposed to be laparoscopic however several complications occurred.  Evaluated by surgeon yesterday will see them again next week.       Dispo: Follow-up in 6 months with Dr. Dulce Sellar.  Signed, Flossie Dibble, NP

## 2022-09-07 ENCOUNTER — Encounter: Payer: Self-pay | Admitting: Cardiology

## 2022-09-07 ENCOUNTER — Ambulatory Visit: Payer: Medicare Other | Admitting: Cardiology

## 2022-09-07 VITALS — BP 130/78 | HR 85 | Ht 64.0 in | Wt 204.2 lb

## 2022-09-07 DIAGNOSIS — I10 Essential (primary) hypertension: Secondary | ICD-10-CM | POA: Diagnosis present

## 2022-09-07 DIAGNOSIS — I2510 Atherosclerotic heart disease of native coronary artery without angina pectoris: Secondary | ICD-10-CM

## 2022-09-07 DIAGNOSIS — I2584 Coronary atherosclerosis due to calcified coronary lesion: Secondary | ICD-10-CM | POA: Diagnosis present

## 2022-09-07 DIAGNOSIS — E782 Mixed hyperlipidemia: Secondary | ICD-10-CM | POA: Diagnosis present

## 2022-09-07 DIAGNOSIS — J449 Chronic obstructive pulmonary disease, unspecified: Secondary | ICD-10-CM | POA: Diagnosis present

## 2022-09-07 MED ORDER — VALSARTAN 80 MG PO TABS: 80.0000 mg | ORAL_TABLET | Freq: Every day | ORAL | 3 refills | Status: AC

## 2022-09-07 MED ORDER — CARVEDILOL 6.25 MG PO TABS: 6.2500 mg | ORAL_TABLET | Freq: Two times a day (BID) | ORAL | 3 refills | Status: DC

## 2022-09-07 MED ORDER — CLONIDINE HCL 0.1 MG PO TABS: 0.1000 mg | ORAL_TABLET | Freq: Two times a day (BID) | ORAL | 3 refills | Status: DC

## 2022-09-07 NOTE — Patient Instructions (Addendum)
Medication Instructions:  Your physician has recommended you make the following change in your medication:   START: Clonidine 0.1 mg twice daily START: Coreg 6.25 mg twice daily  *If you need a refill on your cardiac medications before your next appointment, please call your pharmacy*   Lab Work: None If you have labs (blood work) drawn today and your tests are completely normal, you will receive your results only by: MyChart Message (if you have MyChart) OR A paper copy in the mail If you have any lab test that is abnormal or we need to change your treatment, we will call you to review the results.   Testing/Procedures: None   Follow-Up: At [facility], you and your health needs are our priority.  As part of our continuing mission to provide you with exceptional heart care, we have created designated Provider Care Teams.  These Care Teams include your primary Cardiologist (physician) and Advanced Practice Providers (APPs -  Physician Assistants and Nurse Practitioners) who all work together to provide you with the care you need, when you need it.  We recommend signing up for the patient portal called "MyChart".  Sign up information is provided on this After Visit Summary.  MyChart is used to connect with patients for Virtual Visits (Telemedicine).  Patients are able to view lab/test results, encounter notes, upcoming appointments, etc.  Non-urgent messages can be sent to your provider as well.   To learn more about what you can do with MyChart, go to [email or web address].    Your next appointment:   6 month(s)  Provider:   Norman Herrlich, MD    Other Instructions None

## 2023-02-18 ENCOUNTER — Other Ambulatory Visit: Payer: Self-pay

## 2023-02-18 MED ORDER — ISOSORBIDE MONONITRATE ER 30 MG PO TB24: 30.0000 mg | ORAL_TABLET | Freq: Every day | ORAL | 2 refills | Status: DC

## 2023-02-21 ENCOUNTER — Telehealth: Payer: Self-pay | Admitting: Cardiology

## 2023-02-21 ENCOUNTER — Other Ambulatory Visit: Payer: Self-pay

## 2023-02-21 DIAGNOSIS — I110 Hypertensive heart disease with heart failure: Secondary | ICD-10-CM

## 2023-02-21 DIAGNOSIS — J9611 Chronic respiratory failure with hypoxia: Secondary | ICD-10-CM

## 2023-02-21 MED ORDER — POTASSIUM CHLORIDE ER 10 MEQ PO CPCR: 10.0000 meq | ORAL_CAPSULE | Freq: Every morning | ORAL | 3 refills | Status: AC

## 2023-02-21 NOTE — Telephone Encounter (Signed)
Called patient and she reported that she had swelling in her finger's and hands and also in her feet. She stated that she was currently taking Lasix and it was not working to reduce the swelling. Spoke with Wallis Bamberg regarding the patient's symptoms and she recommended to have the patient have a BMP and Pro BNP. I relayed Wallis Bamberg NP's recommendation to the patient and she was agreeable with that plan and verbalized understanding. She also mentioned that she was out of her Potassium medication and a re-fill was sent to her pharmacy. Patient had no further questions at this time.

## 2023-02-21 NOTE — Telephone Encounter (Signed)
Pt c/o swelling/edema: STAT if pt has developed SOB within 24 hours  If swelling, where is the swelling located? Fingers and ankles  How much weight have you gained and in what time span?   Have you gained 2 pounds in a day or 5 pounds in a week?   Do you have a log of your daily weights (if so, list)?   Are you currently taking a fluid pill? Yes= been taking extra, she said it was not helping  Are you currently SOB? Short of breath when moving around  Have you traveled recently in a car or plane for an extended period of time? No- patient wants to be seen- I made her an appointment for 02-27-25 with Victorino Dike on Monday(02-25-23)

## 2023-02-22 ENCOUNTER — Other Ambulatory Visit: Payer: Self-pay

## 2023-02-23 LAB — BASIC METABOLIC PANEL WITH GFR
BUN/Creatinine Ratio: 17 (ref 12–28)
BUN: 11 mg/dL (ref 8–27)
CO2: 21 mmol/L (ref 20–29)
Calcium: 8.8 mg/dL (ref 8.7–10.3)
Chloride: 107 mmol/L — ABNORMAL HIGH (ref 96–106)
Creatinine, Ser: 0.65 mg/dL (ref 0.57–1.00)
Glucose: 116 mg/dL — ABNORMAL HIGH (ref 70–99)
Potassium: 4.1 mmol/L (ref 3.5–5.2)
Sodium: 146 mmol/L — ABNORMAL HIGH (ref 134–144)
eGFR: 95 mL/min/1.73

## 2023-02-23 LAB — PRO B NATRIURETIC PEPTIDE: NT-Pro BNP: 45 pg/mL (ref 0–301)

## 2023-02-24 NOTE — Progress Notes (Unsigned)
Cardiology Office Note:  .   Date:  02/25/2023  ID:  [name], DOB 1956/01/02, MRN [id number] PCP: Ailene Ravel, MD  [facility] Providers Cardiologist:  Norman Herrlich, MD    History of Present Illness: .   [name] is a 68 y.o. female with a past medical history of nonobstructive CAD per coronary CTA, hypertension, COPD, OSA, GERD, history of GI bleed, urinary retention, hyperlipidemia.  09/02/2021 echo EF 60 to 65%, mild concentric LVH, grade 1 DD, trivial MR 01/02/2021 coronary CTA calcium score 688, 96 percentile, moderate stenosis of her LAD, FFR demonstrated low probability for hemodynamically significant stenosis  Evaluated by Dr. Dulce Sellar on 06/27/2022, she was hypotensive and her medication regimen was rearranged.  Also some issues with affording her medications and a social work consult was placed.  Evaluated 09/07/2022, she had recently been hospitalized, several of her medications stopped secondary to hypotension however had been resumed. She is a retired Engineer, civil (consulting) and well versed in managing her medications.  She presents today with concerns of pedal edema and shortness of breath.  She adjusted her Lasix to twice daily for a few days, diuresed approximately 6 pounds and is feeling better.  She is been under a lot of stress over the last few months, now providing total care for her husband.  She contacted our office last week with concerns of edema and shortness of breath >> had her come in for BMP which was normal.  She denies chest pain, palpitations, dyspnea, pnd, orthopnea, n, v, dizziness, syncope, or early satiety.    ROS: Review of Systems  Cardiovascular:  Positive for leg swelling.  All other systems reviewed and are negative.     Studies Reviewed: .        Cardiac Studies & Procedures      ECHOCARDIOGRAM  ECHOCARDIOGRAM COMPLETE 09/02/2021  Narrative ECHOCARDIOGRAM REPORT    Patient Name:   [name] Date of Exam:  09/02/2021 Medical Rec #:  [id number]           Height:       64.0 in Accession #:    [id number]          Weight:       188.3 lb Date of Birth:  10/15/1955           BSA:          1.907 m Patient Age:    66 years            BP:           139/99 mmHg Patient Gender: F                   HR:           61 bpm. Exam Location:  Inpatient  Procedure: 2D Echo, Cardiac Doppler and Color Doppler  Indications:    Chest Pain  History:        Patient has no prior history of Echocardiogram examinations. CAD, COPD; Risk Factors:Dyslipidemia, Sleep Apnea and Hypertension.  Sonographer:    Eduard Roux Referring Phys: 78 RHONDA G BARRETT   Sonographer Comments: Image acquisition challenging due to respiratory motion. IMPRESSIONS   1. Left ventricular ejection fraction, by estimation, is 60 to 65%. The left ventricle has normal function. The left ventricle has no regional wall motion abnormalities. There is mild concentric left ventricular hypertrophy. Left ventricular diastolic parameters are consistent with Grade I diastolic dysfunction (impaired relaxation). 2. Right ventricular systolic  function is normal. The right ventricular size is normal. 3. The mitral valve is normal in structure. Trivial mitral valve regurgitation. No evidence of mitral stenosis. 4. The aortic valve is tricuspid. Aortic valve regurgitation is trivial. No aortic stenosis is present. 5. The inferior vena cava is normal in size with greater than 50% respiratory variability, suggesting right atrial pressure of 3 mmHg.  Comparison(s): No prior Echocardiogram.  FINDINGS Left Ventricle: Left ventricular ejection fraction, by estimation, is 60 to 65%. The left ventricle has normal function. The left ventricle has no regional wall motion abnormalities. The left ventricular internal cavity size was normal in size. There is mild concentric left ventricular hypertrophy. Left ventricular diastolic parameters are consistent with  Grade I diastolic dysfunction (impaired relaxation).  Right Ventricle: The right ventricular size is normal. Right ventricular systolic function is normal.  Left Atrium: Left atrial size was normal in size.  Right Atrium: Right atrial size was normal in size.  Pericardium: There is no evidence of pericardial effusion.  Mitral Valve: The mitral valve is normal in structure. Trivial mitral valve regurgitation. No evidence of mitral valve stenosis.  Tricuspid Valve: The tricuspid valve is normal in structure. Tricuspid valve regurgitation is trivial. No evidence of tricuspid stenosis.  Aortic Valve: The aortic valve is tricuspid. Aortic valve regurgitation is trivial. No aortic stenosis is present. Aortic valve peak gradient measures 7.0 mmHg.  Pulmonic Valve: The pulmonic valve was not well visualized. Pulmonic valve regurgitation is not visualized. No evidence of pulmonic stenosis.  Aorta: The aortic root is normal in size and structure.  Venous: The inferior vena cava is normal in size with greater than 50% respiratory variability, suggesting right atrial pressure of 3 mmHg.  IAS/Shunts: No atrial level shunt detected by color flow Doppler.   LEFT VENTRICLE PLAX 2D LVIDd:         4.40 cm   Diastology LVIDs:         2.70 cm   LV e' medial:    3.49 cm/s LV PW:         1.20 cm   LV E/e' medial:  9.2 LV IVS:        1.40 cm   LV e' lateral:   5.27 cm/s LVOT diam:     1.90 cm   LV E/e' lateral: 6.1 LV SV:         82 LV SV Index:   43 LVOT Area:     2.84 cm   RIGHT VENTRICLE             IVC RV Basal diam:  2.70 cm     IVC diam: 1.90 cm RV S prime:     10.70 cm/s TAPSE (M-mode): 1.8 cm  LEFT ATRIUM             Index        RIGHT ATRIUM           Index LA diam:        4.00 cm 2.10 cm/m   RA Area:     17.10 cm LA Vol (A2C):   48.7 ml 25.54 ml/m  RA Volume:   47.00 ml  24.65 ml/m LA Vol (A4C):   53.1 ml 27.85 ml/m LA Biplane Vol: 51.7 ml 27.11 ml/m AORTIC VALVE                  PULMONIC VALVE AV Area (Vmax): 2.76 cm     PV Vmax:       0.78  m/s AV Vmax:        132.50 cm/s  PV Peak grad:  2.5 mmHg AV Peak Grad:   7.0 mmHg LVOT Vmax:      129.00 cm/s LVOT Vmean:     77.700 cm/s LVOT VTI:       0.288 m  AORTA Ao Root diam: 3.40 cm Ao Asc diam:  3.30 cm  MITRAL VALVE MV Area (PHT): 3.26 cm    SHUNTS MV Decel Time: 233 msec    Systemic VTI:  0.29 m MV E velocity: 32.10 cm/s  Systemic Diam: 1.90 cm MV A velocity: 60.00 cm/s MV E/A ratio:  0.54  Olga Millers MD Electronically signed by Olga Millers MD Signature Date/Time: 09/02/2021/1:19:50 PM    Final    CT SCANS  CT CORONARY FRACTIONAL FLOW RESERVE DATA PREP 01/03/2021  Narrative EXAM: FFRCT ANALYSIS  FINDINGS: FFRct analysis was performed on the original cardiac CT angiogram dataset. Diagrammatic representation of the FFRct analysis is provided in a separate PDF document in PACS. This dictation was created using the PDF document and an interactive 3D model of the results. 3D model is not available in the EMR/PACS. Normal FFR range is >0.80.  1. LM: 99  2. LAD: Prox 0.97, mid 0.92, distal 0.86  3. LCX: Prox 0.98, mid 0.97, distal 0.96  4. RCA: Prox 0.99, mid 0.98, distal 0.98  IMPRESSION: No hemodynamically significant lesion identified.  Mid LAD lesion in question has low probability for hemodynamically significant stenosis.  Georgeanna Lea, MD   Electronically Signed By: Gypsy Balsam M.D. On: 01/03/2021 15:53   CT SCANS  CT CORONARY MORPH W/CTA COR W/SCORE 01/02/2021  Addendum 01/03/2021 12:47 PM ADDENDUM REPORT: 01/03/2021 12:45  CLINICAL DATA:  Chest pain  EXAM: Cardiac/Coronary  CTA  TECHNIQUE: The patient was scanned on a [employer].  FINDINGS: A 120 kV prospective scan was triggered in the descending thoracic aorta at 111 HU's. Axial non-contrast 3 mm slices were carried out through the heart. The data set was analyzed on a dedicated  work station and scored using the Agatson method. Gantry rotation speed was 250 msecs and collimation was .6 mm. No beta blockade and 0.8 mg of sl NTG was given. The 3D data set was reconstructed in 5% intervals of the 67-82 % of the R-R cycle. Diastolic phases were analyzed on a dedicated work station using MPR, MIP and VRT modes. The patient received 80 cc of contrast.  Aorta: Normal size. Moderate calcifications in ascending and descending aorta. No dissection.  Aortic Valve:  Trileaflet.  No calcifications.  Coronary Arteries:  Normal coronary origin.  Right dominance.  RCA is a large dominant artery that gives rise to PDA and PLA. There are multiple small, calcified, non-obstructive (0-25% stenosis) plaques noted in proximal, mid and distal portion of this artery.  Left main is a large artery that gives rise to LAD and LCX arteries. There is minimal, calcified plaque (0-25%) stenosis at the ostium and mid portion of this artery.  LAD is a large vessel that has multiple calcified, moderate plaques (50-75% stenosis) plaques in its pr mid portion. Proximal and distal portion of this artery has minimal, non-obstructive, calcified (0-25% stenosis) plaques. This artery gives rise to small D1 and small D2.  LCX is a non-dominant artery that gives rise to moderate size OM1 and large OM2 branch. LCX is diffusely diseased with minimal, non-obstructive calcified plaques of 0-25% stenosis.  Other findings:  Normal pulmonary vein drainage into the  left atrium.  Normal left atrial appendage without a thrombus.  Normal size of the pulmonary artery.  IMPRESSION: 1. Coronary calcium score of 688. This was 16 percentile for age and sex matched control.  2. Normal coronary origin with right dominance.  3. CAD-RADS 3. Moderate stenosis (mid LAD). Consider symptom-guided anti-ischemic pharmacotherapy as well as risk factor modification per guideline directed care. Additional analysis  with CT FFR will be submitted.  4. Pictures will be submitted for FFR analysis of mid LAD stenosis.  Georgeanna Lea, MD   Electronically Signed By: Gypsy Balsam M.D. On: 01/03/2021 12:45  Narrative EXAM: OVER-READ INTERPRETATION  CT CHEST  The following report is an over-read performed by radiologist Dr. Trudie Reed of [facility] on 01/02/2021. This over-read does not include interpretation of cardiac or coronary anatomy or pathology. The coronary calcium score/coronary CTA interpretation by the cardiologist is attached.  COMPARISON:  None.  FINDINGS: Atherosclerotic calcifications throughout the thoracic aorta. Within the visualized portions of the thorax there are no suspicious appearing pulmonary nodules or masses, there is no acute consolidative airspace disease, no pleural effusions, no pneumothorax and no lymphadenopathy. Visualized portions of the upper abdomen demonstrates diffuse low attenuation throughout the visualized hepatic parenchyma, indicative of hepatic steatosis. There are no aggressive appearing lytic or blastic lesions noted in the visualized portions of the skeleton.  IMPRESSION: 1.  Aortic Atherosclerosis (ICD10-I70.0). 2. Hepatic steatosis.  Electronically Signed: By: Trudie Reed M.D. On: 01/02/2021 16:01          Risk Assessment/Calculations:             Physical Exam:   VS:  BP 130/76   Pulse 60   Ht 5\' 4"  (1.626 m)   Wt 199 lb (90.3 kg)   SpO2 97%   BMI 34.16 kg/m    Wt Readings from Last 3 Encounters:  02/25/23 199 lb (90.3 kg)  09/07/22 204 lb 3.2 oz (92.6 kg)  06/27/22 208 lb 6.4 oz (94.5 kg)    GEN: Well nourished, well developed in no acute distress NECK: No JVD; No carotid bruits CARDIAC: RRR, no murmurs, rubs, gallops RESPIRATORY:  Clear to auscultation without rales, wheezing or rhonchi  ABDOMEN: Soft, non-tender, non-distended EXTREMITIES:  No edema; No deformity   ASSESSMENT AND  PLAN: .   CAD - nonobstructive per cCTA, Stable with no anginal symptoms. No indication for ischemic evaluation.  Continue aspirin 81 mg daily, continue Coreg 6.25 mg twice daily, continue Zetia 10 mg daily, continue nitroglycerin as needed, continue Crestor 10 mg daily, continue Imdur 30 mg daily.  Pedal edema and shortness of breath-she came in last week with Dr. proBNP which was normal, potassium 4.1, creatinine 0.65.  Will repeat echocardiogram.  Will continue her Lasix however she can increase to 40 mg daily as needed for weight gain of 2 pounds in 1 day, or pedal edema.  She is currently without medication coverage, would not be able to afford other medications at this time.  HTN -blood pressure today is controlled at 130/76 she staggers her medications throughout the day and checks her blood pressure frequently.  Continue Coreg 6.25 mg twice daily, continue valsartan 80 mg daily, continue clonidine 0.1 mg twice daily, continue diltiazem 120 mg twice daily.  HLD -LDL on 02/05/2022 was well-controlled at 69, continue Crestor 10 mg daily, continue Zetia 10 mg daily.         Dispo: Repeat echo, may take an additional Lasix as needed for weight gain  of 2 pounds in 1 day/pedal edema/worsening shortness of breath.  Signed, Flossie Dibble, NP

## 2023-02-25 ENCOUNTER — Encounter: Payer: Self-pay | Admitting: Cardiology

## 2023-02-25 ENCOUNTER — Ambulatory Visit: Payer: Medicare Other | Attending: Cardiology | Admitting: Cardiology

## 2023-02-25 VITALS — BP 130/76 | HR 60 | Ht 64.0 in | Wt 199.0 lb

## 2023-02-25 DIAGNOSIS — I110 Hypertensive heart disease with heart failure: Secondary | ICD-10-CM

## 2023-02-25 DIAGNOSIS — I5032 Chronic diastolic (congestive) heart failure: Secondary | ICD-10-CM | POA: Diagnosis present

## 2023-02-25 DIAGNOSIS — I2510 Atherosclerotic heart disease of native coronary artery without angina pectoris: Secondary | ICD-10-CM

## 2023-02-25 DIAGNOSIS — J9611 Chronic respiratory failure with hypoxia: Secondary | ICD-10-CM | POA: Diagnosis present

## 2023-02-25 DIAGNOSIS — I2584 Coronary atherosclerosis due to calcified coronary lesion: Secondary | ICD-10-CM

## 2023-02-25 DIAGNOSIS — I10 Essential (primary) hypertension: Secondary | ICD-10-CM | POA: Diagnosis present

## 2023-02-25 DIAGNOSIS — R0600 Dyspnea, unspecified: Secondary | ICD-10-CM | POA: Insufficient documentation

## 2023-02-25 MED ORDER — FUROSEMIDE 20 MG PO TABS: 20.0000 mg | ORAL_TABLET | ORAL | 3 refills | Status: AC

## 2023-02-25 MED ORDER — CARVEDILOL 6.25 MG PO TABS: 6.2500 mg | ORAL_TABLET | Freq: Two times a day (BID) | ORAL | 3 refills | Status: AC

## 2023-02-25 NOTE — Patient Instructions (Signed)
Medication Instructions:  Your physician has recommended you make the following change in your medication:  Take an extra Lasix daily as needed for wt gain of 2 pds in 1 day or swelling  *If you need a refill on your cardiac medications before your next appointment, please call your pharmacy*   Lab Work: NONE If you have labs (blood work) drawn today and your tests are completely normal, you will receive your results only by: MyChart Message (if you have MyChart) OR A paper copy in the mail If you have any lab test that is abnormal or we need to change your treatment, we will call you to review the results.   Testing/Procedures: Your physician has requested that you have an echocardiogram. Echocardiography is a painless test that uses sound waves to create images of your heart. It provides your doctor with information about the size and shape of your heart and how well your heart's chambers and valves are working. This procedure takes approximately one hour. There are no restrictions for this procedure. Please do NOT wear cologne, perfume, aftershave, or lotions (deodorant is allowed). Please arrive 15 minutes prior to your appointment time.  Please note: We ask at that you not bring children with you during ultrasound (echo/ vascular) testing. Due to room size and safety concerns, children are not allowed in the ultrasound rooms during exams. Our front office staff cannot provide observation of children in our lobby area while testing is being conducted. An adult accompanying a patient to their appointment will only be allowed in the ultrasound room at the discretion of the ultrasound technician under special circumstances. We apologize for any inconvenience.    Follow-Up: At [facility], you and your health needs are our priority.  As part of our continuing mission to provide you with exceptional heart care, we have created designated Provider Care Teams.  These Care Teams include  your primary Cardiologist (physician) and Advanced Practice Providers (APPs -  Physician Assistants and Nurse Practitioners) who all work together to provide you with the care you need, when you need it.  We recommend signing up for the patient portal called "MyChart".  Sign up information is provided on this After Visit Summary.  MyChart is used to connect with patients for Virtual Visits (Telemedicine).  Patients are able to view lab/test results, encounter notes, upcoming appointments, etc.  Non-urgent messages can be sent to your provider as well.   To learn more about what you can do with MyChart, go to [email or web address].    Your next appointment:  Keep F/U with Dr. Dulce Sellar on March 10th at 10:00 am    Provider:   Norman Herrlich, MD    Other Instructions

## 2023-02-27 ENCOUNTER — Ambulatory Visit: Payer: Medicare Other | Attending: Cardiology

## 2023-02-27 DIAGNOSIS — I10 Essential (primary) hypertension: Secondary | ICD-10-CM | POA: Insufficient documentation

## 2023-02-27 DIAGNOSIS — I110 Hypertensive heart disease with heart failure: Secondary | ICD-10-CM | POA: Insufficient documentation

## 2023-02-27 DIAGNOSIS — I2584 Coronary atherosclerosis due to calcified coronary lesion: Secondary | ICD-10-CM | POA: Diagnosis present

## 2023-02-27 DIAGNOSIS — I5032 Chronic diastolic (congestive) heart failure: Secondary | ICD-10-CM | POA: Diagnosis present

## 2023-02-27 DIAGNOSIS — R0600 Dyspnea, unspecified: Secondary | ICD-10-CM | POA: Insufficient documentation

## 2023-02-27 DIAGNOSIS — J9611 Chronic respiratory failure with hypoxia: Secondary | ICD-10-CM | POA: Diagnosis present

## 2023-02-27 DIAGNOSIS — I2510 Atherosclerotic heart disease of native coronary artery without angina pectoris: Secondary | ICD-10-CM | POA: Insufficient documentation

## 2023-02-27 LAB — ECHOCARDIOGRAM COMPLETE
Area-P 1/2: 3.81 cm2
MV M vel: 4.12 m/s
MV Peak grad: 67.9 mmHg
Radius: 0.45 cm
S' Lateral: 2.9 cm

## 2023-02-28 ENCOUNTER — Ambulatory Visit: Payer: Medicare Other | Admitting: Cardiology

## 2023-02-28 ENCOUNTER — Telehealth: Payer: Self-pay | Admitting: Emergency Medicine

## 2023-02-28 NOTE — Telephone Encounter (Signed)
Results reviewed with pt as per Wallis Bamberg NP's note.  Pt verbalized understanding and had no additional questions. Routed to PCP.

## 2023-02-28 NOTE — Telephone Encounter (Signed)
-----   Message from Flossie Dibble sent at 02/28/2023  7:52 AM EST ----- Echo showed that your heart is squeezing normally, stiff when it relaxes -- which could cause some shortness of breath. Mild leaking around mitral valve -- would not cause symptoms.  Try to avoid salt. Try to do some walking, this will help as well.   The gabapentin will cause some swelling as well.

## 2023-02-28 NOTE — Telephone Encounter (Signed)
LVM 1/30

## 2023-04-06 NOTE — Progress Notes (Deleted)
 Cardiology Office Note:    Date:  04/06/2023   ID:  [name], DOB 12-Oct-1955, MRN [id number]  PCP:  Ailene Ravel, MD  Cardiologist:  Norman Herrlich, MD    Referring MD: Ailene Ravel, MD    ASSESSMENT:    1. Coronary artery disease due to calcified coronary lesion   2. Hypertensive heart disease with chronic diastolic congestive heart failure (HCC)   3. Mixed hyperlipidemia   4. Chronic obstructive pulmonary disease, unspecified COPD type (HCC)    PLAN:    In order of problems listed above:  ***   Next appointment: ***   Medication Adjustments/Labs and Tests Ordered: Current medicines are reviewed at length with the patient today.  Concerns regarding medicines are outlined above.  No orders of the defined types were placed in this encounter.  No orders of the defined types were placed in this encounter.    History of Present Illness:    [name] is a 68 y.o. female with a hx of CAD hypertension hyperlipidemia COPD and obstructive sleep apnea last seen 09/07/2022. Compliance with diet, lifestyle and medications: *** Past Medical History:  Diagnosis Date   Allergy    sinus problems   Bilateral radial fractures    Dr. Mardene Speak is managing   CAD (coronary artery disease)    Chest pain 09/01/2021   Chronic respiratory failure with hypoxia (HCC)    Colonic ischemia (HCC) 03/21/2013   COPD (chronic obstructive pulmonary disease) (HCC)    Depression with anxiety    Dysuria 04/25/2021   Emphysema of lung (HCC)    GERD (gastroesophageal reflux disease)    GI bleeding    Heart murmur    Dr. Dulce Sellar   Hyperlipidemia    Hypertension, essential, benign    Insomnia    Lumbar radicular pain    Obesity    Osteoarthritis of right hip    Osteoporosis    Pneumonia    hx of pneumonia after surgery   PONV (postoperative nausea and vomiting)    Prolonged QT interval 09/01/2021   Retention of urine 04/25/2021   Sleep apnea    CPAP    Spondylolisthesis of lumbar region 12/17/2016   Umbilical hernia    Vitamin D deficiency     Current Medications: No outpatient medications have been marked as taking for the 04/08/23 encounter (Appointment) with Baldo Daub, MD.      EKGs/Labs/Other Studies Reviewed:    The following studies were reviewed today:  Cardiac Studies & Procedures   ______________________________________________________________________________________________     ECHOCARDIOGRAM  ECHOCARDIOGRAM COMPLETE 02/27/2023  Narrative ECHOCARDIOGRAM REPORT    Patient Name:   [name] Date of Exam: 02/27/2023 Medical Rec #:  [id number]           Height:       64.0 in Accession #:    [id number]          Weight:       199.0 lb Date of Birth:  April 24, 1955           BSA:          1.952 m Patient Age:    67 years            BP:           130/76 mmHg Patient Gender: F                   HR:  57 bpm. Exam Location:    Procedure: 2D Echo, Cardiac Doppler, Color Doppler and Strain Analysis  Indications:    Chronic respiratory failure with hypoxia (HCC) [M57.84 (ICD-10-CM)]; Hypertensive heart disease with chronic diastolic congestive heart failure (HCC) [I11.0, I50.32 (ICD-10-CM)]; Coronary artery disease due to calcified coronary lesion [I25.10, I25.84 (ICD-10-CM)]; Hypertension, essential, benign [I10 (ICD-10-CM)]; Dyspnea, unspecified type [R06.00 (ICD-10-CM)]  History:        Patient has prior history of Echocardiogram examinations, most recent 09/02/2021. CHF, CAD, COPD, Signs/Symptoms:Dyspnea and Edema; Risk Factors:Hypertension and Dyslipidemia.  Sonographer:    Margreta Journey RDCS Referring Phys: [phone] Flossie Dibble   Sonographer Comments: Suboptimal parasternal window. IMPRESSIONS   1. Left ventricular ejection fraction, by estimation, is 60 to 65%. The left ventricle has normal function. The left ventricle has no regional wall motion abnormalities. There is mild  left ventricular hypertrophy. Left ventricular diastolic parameters are consistent with Grade II diastolic dysfunction (pseudonormalization). The average left ventricular global longitudinal strain is -15.8 %. The global longitudinal strain is abnormal. 2. Right ventricular systolic function is normal. The right ventricular size is normal. There is normal pulmonary artery systolic pressure. 3. The mitral valve is normal in structure. Mild mitral valve regurgitation. No evidence of mitral stenosis. 4. The aortic valve is normal in structure. Aortic valve regurgitation is not visualized. No aortic stenosis is present. 5. The inferior vena cava is normal in size with greater than 50% respiratory variability, suggesting right atrial pressure of 3 mmHg.  FINDINGS Left Ventricle: Left ventricular ejection fraction, by estimation, is 60 to 65%. The left ventricle has normal function. The left ventricle has no regional wall motion abnormalities. The average left ventricular global longitudinal strain is -15.8 %. The global longitudinal strain is abnormal. The left ventricular internal cavity size was normal in size. There is mild left ventricular hypertrophy. Left ventricular diastolic parameters are consistent with Grade II diastolic dysfunction (pseudonormalization).  Right Ventricle: The right ventricular size is normal. No increase in right ventricular wall thickness. Right ventricular systolic function is normal. There is normal pulmonary artery systolic pressure. The tricuspid regurgitant velocity is 1.82 m/s, and with an assumed right atrial pressure of 3 mmHg, the estimated right ventricular systolic pressure is 16.2 mmHg.  Left Atrium: Left atrial size was normal in size.  Right Atrium: Right atrial size was normal in size.  Pericardium: There is no evidence of pericardial effusion.  Mitral Valve: The mitral valve is normal in structure. Mild mitral valve regurgitation. No evidence of mitral  valve stenosis.  Tricuspid Valve: The tricuspid valve is normal in structure. Tricuspid valve regurgitation is not demonstrated. No evidence of tricuspid stenosis.  Aortic Valve: The aortic valve is normal in structure. Aortic valve regurgitation is not visualized. No aortic stenosis is present.  Pulmonic Valve: The pulmonic valve was normal in structure. Pulmonic valve regurgitation is not visualized. No evidence of pulmonic stenosis.  Aorta: The aortic root is normal in size and structure.  Venous: The inferior vena cava is normal in size with greater than 50% respiratory variability, suggesting right atrial pressure of 3 mmHg.  IAS/Shunts: No atrial level shunt detected by color flow Doppler.   LEFT VENTRICLE PLAX 2D LVIDd:         4.20 cm   Diastology LVIDs:         2.90 cm   LV e' medial:    5.44 cm/s LV PW:         1.10 cm   LV E/e' medial:  16.6 LV IVS:        1.30 cm   LV e' lateral:   9.14 cm/s LVOT diam:     2.00 cm   LV E/e' lateral: 9.9 LV SV:         85 LV SV Index:   44        2D Longitudinal Strain LVOT Area:     3.14 cm  2D Strain GLS Avg:     -15.8 %   RIGHT VENTRICLE             IVC RV Basal diam:  3.10 cm     IVC diam: 1.70 cm RV Mid diam:    2.70 cm RV S prime:     15.10 cm/s TAPSE (M-mode): 2.6 cm  LEFT ATRIUM             Index        RIGHT ATRIUM           Index LA diam:        3.60 cm 1.84 cm/m   RA Area:     15.40 cm LA Vol (A2C):   53.5 ml 27.40 ml/m  RA Volume:   38.10 ml  19.52 ml/m LA Vol (A4C):   46.2 ml 23.67 ml/m LA Biplane Vol: 51.7 ml 26.48 ml/m AORTIC VALVE LVOT Vmax:   125.50 cm/s LVOT Vmean:  74.900 cm/s LVOT VTI:    0.272 m  AORTA Ao Root diam: 3.50 cm Ao Asc diam:  3.60 cm Ao Desc diam: 2.60 cm  MITRAL VALVE                  TRICUSPID VALVE MV Area (PHT): 3.81 cm       TR Peak grad:   13.2 mmHg MV Decel Time: 199 msec       TR Vmax:        182.00 cm/s MR Peak grad:    67.9 mmHg MR Vmax:         412.00 cm/s  SHUNTS MR  PISA:         1.27 cm     Systemic VTI:  0.27 m MR PISA Eff ROA: 12 mm       Systemic Diam: 2.00 cm MR PISA Radius:  0.45 cm MV E velocity: 90.10 cm/s MV A velocity: 92.00 cm/s MV E/A ratio:  0.98  Gypsy Balsam MD Electronically signed by Gypsy Balsam MD Signature Date/Time: 02/27/2023/5:34:01 PM    Final      CT SCANS  CT CORONARY FRACTIONAL FLOW RESERVE DATA PREP 01/03/2021  Narrative EXAM: FFRCT ANALYSIS  FINDINGS: FFRct analysis was performed on the original cardiac CT angiogram dataset. Diagrammatic representation of the FFRct analysis is provided in a separate PDF document in PACS. This dictation was created using the PDF document and an interactive 3D model of the results. 3D model is not available in the EMR/PACS. Normal FFR range is >0.80.  1. LM: 99  2. LAD: Prox 0.97, mid 0.92, distal 0.86  3. LCX: Prox 0.98, mid 0.97, distal 0.96  4. RCA: Prox 0.99, mid 0.98, distal 0.98  IMPRESSION: No hemodynamically significant lesion identified.  Mid LAD lesion in question has low probability for hemodynamically significant stenosis.  Georgeanna Lea, MD   Electronically Signed By: Gypsy Balsam M.D. On: 01/03/2021 15:53   CT SCANS  CT CORONARY MORPH W/CTA COR W/SCORE 01/02/2021  Addendum 01/03/2021 12:47 PM ADDENDUM REPORT: 01/03/2021 12:45  CLINICAL DATA:  Chest pain  EXAM: Cardiac/Coronary  CTA  TECHNIQUE: The patient was scanned on a [employer].  FINDINGS: A 120 kV prospective scan was triggered in the descending thoracic aorta at 111 HU's. Axial non-contrast 3 mm slices were carried out through the heart. The data set was analyzed on a dedicated work station and scored using the Agatson method. Gantry rotation speed was 250 msecs and collimation was .6 mm. No beta blockade and 0.8 mg of sl NTG was given. The 3D data set was reconstructed in 5% intervals of the 67-82 % of the R-R cycle. Diastolic phases  were analyzed on a dedicated work station using MPR, MIP and VRT modes. The patient received 80 cc of contrast.  Aorta: Normal size. Moderate calcifications in ascending and descending aorta. No dissection.  Aortic Valve:  Trileaflet.  No calcifications.  Coronary Arteries:  Normal coronary origin.  Right dominance.  RCA is a large dominant artery that gives rise to PDA and PLA. There are multiple small, calcified, non-obstructive (0-25% stenosis) plaques noted in proximal, mid and distal portion of this artery.  Left main is a large artery that gives rise to LAD and LCX arteries. There is minimal, calcified plaque (0-25%) stenosis at the ostium and mid portion of this artery.  LAD is a large vessel that has multiple calcified, moderate plaques (50-75% stenosis) plaques in its pr mid portion. Proximal and distal portion of this artery has minimal, non-obstructive, calcified (0-25% stenosis) plaques. This artery gives rise to small D1 and small D2.  LCX is a non-dominant artery that gives rise to moderate size OM1 and large OM2 branch. LCX is diffusely diseased with minimal, non-obstructive calcified plaques of 0-25% stenosis.  Other findings:  Normal pulmonary vein drainage into the left atrium.  Normal left atrial appendage without a thrombus.  Normal size of the pulmonary artery.  IMPRESSION: 1. Coronary calcium score of 688. This was 104 percentile for age and sex matched control.  2. Normal coronary origin with right dominance.  3. CAD-RADS 3. Moderate stenosis (mid LAD). Consider symptom-guided anti-ischemic pharmacotherapy as well as risk factor modification per guideline directed care. Additional analysis with CT FFR will be submitted.  4. Pictures will be submitted for FFR analysis of mid LAD stenosis.  Georgeanna Lea, MD   Electronically Signed By: Gypsy Balsam M.D. On: 01/03/2021 12:45  Narrative EXAM: OVER-READ INTERPRETATION  CT  CHEST  The following report is an over-read performed by radiologist Dr. Trudie Reed of [facility] on 01/02/2021. This over-read does not include interpretation of cardiac or coronary anatomy or pathology. The coronary calcium score/coronary CTA interpretation by the cardiologist is attached.  COMPARISON:  None.  FINDINGS: Atherosclerotic calcifications throughout the thoracic aorta. Within the visualized portions of the thorax there are no suspicious appearing pulmonary nodules or masses, there is no acute consolidative airspace disease, no pleural effusions, no pneumothorax and no lymphadenopathy. Visualized portions of the upper abdomen demonstrates diffuse low attenuation throughout the visualized hepatic parenchyma, indicative of hepatic steatosis. There are no aggressive appearing lytic or blastic lesions noted in the visualized portions of the skeleton.  IMPRESSION: 1.  Aortic Atherosclerosis (ICD10-I70.0). 2. Hepatic steatosis.  Electronically Signed: By: Trudie Reed M.D. On: 01/02/2021 16:01     ______________________________________________________________________________________________          Recent Labs: 02/22/2023: BUN 11; Creatinine, Ser 0.65; NT-Pro BNP 45; Potassium 4.1; Sodium 146  Recent Lipid Panel    Component Value Date/Time   CHOL 161 09/02/2021 0125  TRIG 113 09/02/2021 0125   HDL 34 (L) 09/02/2021 0125   CHOLHDL 4.7 09/02/2021 0125   VLDL 23 09/02/2021 0125   LDLCALC 104 (H) 09/02/2021 0125    Physical Exam:    VS:  There were no vitals taken for this visit.    Wt Readings from Last 3 Encounters:  02/25/23 199 lb (90.3 kg)  09/07/22 204 lb 3.2 oz (92.6 kg)  06/27/22 208 lb 6.4 oz (94.5 kg)     GEN: *** Well nourished, well developed in no acute distress HEENT: Normal NECK: No JVD; No carotid bruits LYMPHATICS: No lymphadenopathy CARDIAC: ***RRR, no murmurs, rubs, gallops RESPIRATORY:  Clear to  auscultation without rales, wheezing or rhonchi  ABDOMEN: Soft, non-tender, non-distended MUSCULOSKELETAL:  No edema; No deformity  SKIN: Warm and dry NEUROLOGIC:  Alert and oriented x 3 PSYCHIATRIC:  Normal affect    Signed, Norman Herrlich, MD  04/06/2023 2:32 PM    [facility]

## 2023-04-08 ENCOUNTER — Ambulatory Visit: Payer: Medicare Other | Attending: Cardiology | Admitting: Cardiology

## 2023-04-08 DIAGNOSIS — J449 Chronic obstructive pulmonary disease, unspecified: Secondary | ICD-10-CM

## 2023-04-08 DIAGNOSIS — I110 Hypertensive heart disease with heart failure: Secondary | ICD-10-CM

## 2023-04-08 DIAGNOSIS — I2510 Atherosclerotic heart disease of native coronary artery without angina pectoris: Secondary | ICD-10-CM

## 2023-04-08 DIAGNOSIS — E782 Mixed hyperlipidemia: Secondary | ICD-10-CM

## 2023-04-11 NOTE — Progress Notes (Unsigned)
 Cardiology Office Note:    Date:  04/12/2023   ID:  [name], DOB 30-Aug-1955, MRN [id number]  PCP:  Ailene Ravel, MD       please do in April be with her upcoming lipid profile in a high risk group I like to see an ApoB of less than 60 as a marker being well optimized. Cardiologist:  Norman Herrlich, MD    Referring MD: Ailene Ravel, MD    ASSESSMENT:    1. Coronary artery disease due to calcified coronary lesion   2. Agatston coronary artery calcium score greater than 400   3. Hypertension, essential, benign   4. Mixed hyperlipidemia   5. Chronic obstructive pulmonary disease, unspecified COPD type (HCC)    PLAN:    In order of problems listed above:  Stable CAD no anginal discomfort continue current medical therapy including aspirin rate limiting calcium channel blocker or nitrate and lipid-lowering with combined high intensity statin and Zetia at this time I do not requires a repeat ischemia evaluation Upcoming lipids had like to see in April be less than 60 as a goal Well-controlled hypertension continue her current multidrug regimen including medic beta-blocker centrally active clonidine calcium channel blocker and ARB.   Continue her current lipid-lowering therapy Presently doing well with COPD continue her current bronchodilator   Next appointment: I will plan to see her in 1 year   Medication Adjustments/Labs and Tests Ordered: Current medicines are reviewed at length with the patient today.  Concerns regarding medicines are outlined above.  No orders of the defined types were placed in this encounter.  No orders of the defined types were placed in this encounter.    History of Present Illness:    [name] is a 68 y.o. female with CAD PREVIOUS CT SCAN CT in 2022 calcium score of 688/96 percentile moderate stenosis mid left anterior descending coronary artery recent labs 02/22/2023 BMP with a creatinine 0.65 GFR 95 cc/min sodium 146 and  potassium 4.1 her proBNP level was quite low at 45 and her lipid profile in January 2028 showed a total cholesterol 135 LDL 69 non-HDL cholesterol 87.  Hypertension episodic hypotension hyperlipidemia and COPD last seen 09/07/2022.  Compliance with diet, lifestyle and medications: Yes  [name] is doing better and attributes much of this to her husband's Parkinson's.  With better control and improvement both of their lives. She has had no angina edema shortness of breath chest pain palpitation or syncope Home blood pressures run 120-130/70-80 She tolerates her lipid-lowering therapy without muscle pain or weakness She has labs upcoming in her PCP office in the next few weeks cholesterol also will check  ApoB to see if she is optimized goal less than 60 Past Medical History:  Diagnosis Date   Allergy    sinus problems   Bilateral radial fractures    Dr. Mardene Speak is managing   CAD (coronary artery disease)    Chest pain 09/01/2021   Chronic respiratory failure with hypoxia (HCC)    Colonic ischemia (HCC) 03/21/2013   COPD (chronic obstructive pulmonary disease) (HCC)    Depression with anxiety    Dysuria 04/25/2021   Emphysema of lung (HCC)    GERD (gastroesophageal reflux disease)    GI bleeding    Heart murmur    Dr. Dulce Sellar   Hyperlipidemia    Hypertension, essential, benign    Insomnia    Lumbar radicular pain    Obesity    Osteoarthritis of right hip  Osteoporosis    Pneumonia    hx of pneumonia after surgery   PONV (postoperative nausea and vomiting)    Prolonged QT interval 09/01/2021   Retention of urine 04/25/2021   Sleep apnea    CPAP   Spondylolisthesis of lumbar region 12/17/2016   Umbilical hernia    Vitamin D deficiency     Current Medications: Current Meds  Medication Sig   albuterol (PROVENTIL) (2.5 MG/3ML) 0.083% nebulizer solution Take 2.5 mg by nebulization every 6 (six) hours as needed for wheezing.   Ascorbic Acid (VITAMIN C) 1000 MG tablet Take 1,000  mg by mouth daily.   aspirin EC 81 MG tablet Take 81 mg by mouth daily. Swallow whole.   Baclofen 5 MG TABS Take 1 tablet by mouth 3 (three) times daily as needed (spasms).   carvedilol (COREG) 6.25 MG tablet Take 1 tablet (6.25 mg total) by mouth 2 (two) times daily.   cloNIDine (CATAPRES) 0.1 MG tablet Take 1 tablet (0.1 mg total) by mouth 2 (two) times daily.   denosumab (PROLIA) 60 MG/ML SOSY injection Inject 60 mg into the skin every 6 (six) months.   diltiazem (CARDIZEM) 120 MG tablet Take 120 mg by mouth 2 (two) times daily.   docusate sodium (COLACE) 100 MG capsule Take 1 capsule (100 mg total) 2 (two) times daily by mouth.   ezetimibe (ZETIA) 10 MG tablet Take 1 tablet (10 mg total) by mouth daily.   famotidine (PEPCID) 40 MG tablet Take 40 mg by mouth daily.   furosemide (LASIX) 20 MG tablet Take 1 tablet (20 mg total) by mouth as directed. Take additional tablet for wt gain of 2 pds in 1 day or swelling   gabapentin (NEURONTIN) 600 MG tablet Take 600 mg by mouth 3 (three) times daily.   HYDROcodone-acetaminophen (NORCO) 10-325 MG tablet Take 1 tablet by mouth every 4 (four) hours as needed for moderate pain (pain score 4-6) or severe pain (pain score 7-10).   isosorbide mononitrate (IMDUR) 30 MG 24 hr tablet Take 1 tablet (30 mg total) by mouth daily.   Magnesium 200 MG TABS Take 200 mg by mouth daily.   nitroGLYCERIN (NITROSTAT) 0.4 MG SL tablet Place 1 tablet (0.4 mg total) under the tongue every 5 (five) minutes as needed for chest pain.   potassium chloride (MICRO-K) 10 MEQ CR capsule Take 1 capsule (10 mEq total) by mouth every morning.   rosuvastatin (CRESTOR) 10 MG tablet Take 10 mg by mouth daily.   sertraline (ZOLOFT) 50 MG tablet Take 50 mg by mouth daily.   tiZANidine (ZANAFLEX) 4 MG tablet Take 4 mg by mouth 3 (three) times daily as needed for muscle spasms.   valsartan (DIOVAN) 80 MG tablet Take 1 tablet (80 mg total) by mouth daily.   Vitamin D, Ergocalciferol, (DRISDOL)  1.25 MG (50000 UNIT) CAPS capsule Take 50,000 Units by mouth every 7 (seven) days. Thursday   vitamin E 180 MG (400 UNITS) capsule Take 400 Units by mouth daily.   zolpidem (AMBIEN) 5 MG tablet Take 5 mg by mouth at bedtime as needed for sleep.      EKGs/Labs/Other Studies Reviewed:    The following studies were reviewed today:  Cardiac Studies & Procedures   ______________________________________________________________________________________________     ECHOCARDIOGRAM  ECHOCARDIOGRAM COMPLETE 02/27/2023  Narrative ECHOCARDIOGRAM REPORT    Patient Name:   [name] Date of Exam: 02/27/2023 Medical Rec #:  [id number]  Height:       64.0 in Accession #:    [id number]          Weight:       199.0 lb Date of Birth:  27-Mar-1955           BSA:          1.952 m Patient Age:    67 years            BP:           130/76 mmHg Patient Gender: F                   HR:           57 bpm. Exam Location:  [place]  Procedure: 2D Echo, Cardiac Doppler, Color Doppler and Strain Analysis  Indications:    Chronic respiratory failure with hypoxia (HCC) [K74.25 (ICD-10-CM)]; Hypertensive heart disease with chronic diastolic congestive heart failure (HCC) [I11.0, I50.32 (ICD-10-CM)]; Coronary artery disease due to calcified coronary lesion [I25.10, I25.84 (ICD-10-CM)]; Hypertension, essential, benign [I10 (ICD-10-CM)]; Dyspnea, unspecified type [R06.00 (ICD-10-CM)]  History:        Patient has prior history of Echocardiogram examinations, most recent 09/02/2021. CHF, CAD, COPD, Signs/Symptoms:Dyspnea and Edema; Risk Factors:Hypertension and Dyslipidemia.  Sonographer:    Margreta Journey RDCS Referring Phys: [phone] Flossie Dibble   Sonographer Comments: Suboptimal parasternal window. IMPRESSIONS   1. Left ventricular ejection fraction, by estimation, is 60 to 65%. The left ventricle has normal function. The left ventricle has no regional wall motion abnormalities. There  is mild left ventricular hypertrophy. Left ventricular diastolic parameters are consistent with Grade II diastolic dysfunction (pseudonormalization). The average left ventricular global longitudinal strain is -15.8 %. The global longitudinal strain is abnormal. 2. Right ventricular systolic function is normal. The right ventricular size is normal. There is normal pulmonary artery systolic pressure. 3. The mitral valve is normal in structure. Mild mitral valve regurgitation. No evidence of mitral stenosis. 4. The aortic valve is normal in structure. Aortic valve regurgitation is not visualized. No aortic stenosis is present. 5. The inferior vena cava is normal in size with greater than 50% respiratory variability, suggesting right atrial pressure of 3 mmHg.  FINDINGS Left Ventricle: Left ventricular ejection fraction, by estimation, is 60 to 65%. The left ventricle has normal function. The left ventricle has no regional wall motion abnormalities. The average left ventricular global longitudinal strain is -15.8 %. The global longitudinal strain is abnormal. The left ventricular internal cavity size was normal in size. There is mild left ventricular hypertrophy. Left ventricular diastolic parameters are consistent with Grade II diastolic dysfunction (pseudonormalization).  Right Ventricle: The right ventricular size is normal. No increase in right ventricular wall thickness. Right ventricular systolic function is normal. There is normal pulmonary artery systolic pressure. The tricuspid regurgitant velocity is 1.82 m/s, and with an assumed right atrial pressure of 3 mmHg, the estimated right ventricular systolic pressure is 16.2 mmHg.  Left Atrium: Left atrial size was normal in size.  Right Atrium: Right atrial size was normal in size.  Pericardium: There is no evidence of pericardial effusion.  Mitral Valve: The mitral valve is normal in structure. Mild mitral valve regurgitation. No evidence of  mitral valve stenosis.  Tricuspid Valve: The tricuspid valve is normal in structure. Tricuspid valve regurgitation is not demonstrated. No evidence of tricuspid stenosis.  Aortic Valve: The aortic valve is normal in structure. Aortic valve regurgitation is not visualized. No aortic stenosis is present.  Pulmonic Valve: The pulmonic valve was normal in structure. Pulmonic valve regurgitation is not visualized. No evidence of pulmonic stenosis.  Aorta: The aortic root is normal in size and structure.  Venous: The inferior vena cava is normal in size with greater than 50% respiratory variability, suggesting right atrial pressure of 3 mmHg.  IAS/Shunts: No atrial level shunt detected by color flow Doppler.   LEFT VENTRICLE PLAX 2D LVIDd:         4.20 cm   Diastology LVIDs:         2.90 cm   LV e' medial:    5.44 cm/s LV PW:         1.10 cm   LV E/e' medial:  16.6 LV IVS:        1.30 cm   LV e' lateral:   9.14 cm/s LVOT diam:     2.00 cm   LV E/e' lateral: 9.9 LV SV:         85 LV SV Index:   44        2D Longitudinal Strain LVOT Area:     3.14 cm  2D Strain GLS Avg:     -15.8 %   RIGHT VENTRICLE             IVC RV Basal diam:  3.10 cm     IVC diam: 1.70 cm RV Mid diam:    2.70 cm RV S prime:     15.10 cm/s TAPSE (M-mode): 2.6 cm  LEFT ATRIUM             Index        RIGHT ATRIUM           Index LA diam:        3.60 cm 1.84 cm/m   RA Area:     15.40 cm LA Vol (A2C):   53.5 ml 27.40 ml/m  RA Volume:   38.10 ml  19.52 ml/m LA Vol (A4C):   46.2 ml 23.67 ml/m LA Biplane Vol: 51.7 ml 26.48 ml/m AORTIC VALVE LVOT Vmax:   125.50 cm/s LVOT Vmean:  74.900 cm/s LVOT VTI:    0.272 m  AORTA Ao Root diam: 3.50 cm Ao Asc diam:  3.60 cm Ao Desc diam: 2.60 cm  MITRAL VALVE                  TRICUSPID VALVE MV Area (PHT): 3.81 cm       TR Peak grad:   13.2 mmHg MV Decel Time: 199 msec       TR Vmax:        182.00 cm/s MR Peak grad:    67.9 mmHg MR Vmax:         412.00 cm/s   SHUNTS MR PISA:         1.27 cm     Systemic VTI:  0.27 m MR PISA Eff ROA: 12 mm       Systemic Diam: 2.00 cm MR PISA Radius:  0.45 cm MV E velocity: 90.10 cm/s MV A velocity: 92.00 cm/s MV E/A ratio:  0.98  Gypsy Balsam MD Electronically signed by Gypsy Balsam MD Signature Date/Time: 02/27/2023/5:34:01 PM    Final      CT SCANS  CT CORONARY FRACTIONAL FLOW RESERVE DATA PREP 01/03/2021  Narrative EXAM: FFRCT ANALYSIS  FINDINGS: FFRct analysis was performed on the original cardiac CT angiogram dataset. Diagrammatic representation of the FFRct analysis is provided in a separate PDF document in PACS. This  dictation was created using the PDF document and an interactive 3D model of the results. 3D model is not available in the EMR/PACS. Normal FFR range is >0.80.  1. LM: 99  2. LAD: Prox 0.97, mid 0.92, distal 0.86  3. LCX: Prox 0.98, mid 0.97, distal 0.96  4. RCA: Prox 0.99, mid 0.98, distal 0.98  IMPRESSION: No hemodynamically significant lesion identified.  Mid LAD lesion in question has low probability for hemodynamically significant stenosis.  Georgeanna Lea, MD   Electronically Signed By: Gypsy Balsam M.D. On: 01/03/2021 15:53   CT SCANS  CT CORONARY MORPH W/CTA COR W/SCORE 01/02/2021  Addendum 01/03/2021 12:47 PM ADDENDUM REPORT: 01/03/2021 12:45  CLINICAL DATA:  Chest pain  EXAM: Cardiac/Coronary  CTA  TECHNIQUE: The patient was scanned on a [employer].  FINDINGS: A 120 kV prospective scan was triggered in the descending thoracic aorta at 111 HU's. Axial non-contrast 3 mm slices were carried out through the heart. The data set was analyzed on a dedicated work station and scored using the Agatson method. Gantry rotation speed was 250 msecs and collimation was .6 mm. No beta blockade and 0.8 mg of sl NTG was given. The 3D data set was reconstructed in 5% intervals of the 67-82 % of the R-R cycle. Diastolic  phases were analyzed on a dedicated work station using MPR, MIP and VRT modes. The patient received 80 cc of contrast.  Aorta: Normal size. Moderate calcifications in ascending and descending aorta. No dissection.  Aortic Valve:  Trileaflet.  No calcifications.  Coronary Arteries:  Normal coronary origin.  Right dominance.  RCA is a large dominant artery that gives rise to PDA and PLA. There are multiple small, calcified, non-obstructive (0-25% stenosis) plaques noted in proximal, mid and distal portion of this artery.  Left main is a large artery that gives rise to LAD and LCX arteries. There is minimal, calcified plaque (0-25%) stenosis at the ostium and mid portion of this artery.  LAD is a large vessel that has multiple calcified, moderate plaques (50-75% stenosis) plaques in its pr mid portion. Proximal and distal portion of this artery has minimal, non-obstructive, calcified (0-25% stenosis) plaques. This artery gives rise to small D1 and small D2.  LCX is a non-dominant artery that gives rise to moderate size OM1 and large OM2 branch. LCX is diffusely diseased with minimal, non-obstructive calcified plaques of 0-25% stenosis.  Other findings:  Normal pulmonary vein drainage into the left atrium.  Normal left atrial appendage without a thrombus.  Normal size of the pulmonary artery.  IMPRESSION: 1. Coronary calcium score of 688. This was 3 percentile for age and sex matched control.  2. Normal coronary origin with right dominance.  3. CAD-RADS 3. Moderate stenosis (mid LAD). Consider symptom-guided anti-ischemic pharmacotherapy as well as risk factor modification per guideline directed care. Additional analysis with CT FFR will be submitted.  4. Pictures will be submitted for FFR analysis of mid LAD stenosis.  Georgeanna Lea, MD   Electronically Signed By: Gypsy Balsam M.D. On: 01/03/2021 12:45  Narrative EXAM: OVER-READ INTERPRETATION  CT  CHEST  The following report is an over-read performed by radiologist Dr. Trudie Reed of [facility] on 01/02/2021. This over-read does not include interpretation of cardiac or coronary anatomy or pathology. The coronary calcium score/coronary CTA interpretation by the cardiologist is attached.  COMPARISON:  None.  FINDINGS: Atherosclerotic calcifications throughout the thoracic aorta. Within the visualized portions of the thorax there are no  suspicious appearing pulmonary nodules or masses, there is no acute consolidative airspace disease, no pleural effusions, no pneumothorax and no lymphadenopathy. Visualized portions of the upper abdomen demonstrates diffuse low attenuation throughout the visualized hepatic parenchyma, indicative of hepatic steatosis. There are no aggressive appearing lytic or blastic lesions noted in the visualized portions of the skeleton.  IMPRESSION: 1.  Aortic Atherosclerosis (ICD10-I70.0). 2. Hepatic steatosis.  Electronically Signed: By: Trudie Reed M.D. On: 01/02/2021 16:01     ______________________________________________________________________________________________          Recent Labs: 02/22/2023: BUN 11; Creatinine, Ser 0.65; NT-Pro BNP 45; Potassium 4.1; Sodium 146  Recent Lipid Panel    Component Value Date/Time   CHOL 161 09/02/2021 0125   TRIG 113 09/02/2021 0125   HDL 34 (L) 09/02/2021 0125   CHOLHDL 4.7 09/02/2021 0125   VLDL 23 09/02/2021 0125   LDLCALC 104 (H) 09/02/2021 0125    Physical Exam:    VS:  BP 134/76   Ht 5\' 4"  (1.626 m)   Wt 201 lb (91.2 kg)   SpO2 95%   BMI 34.50 kg/m     Wt Readings from Last 3 Encounters:  04/12/23 201 lb (91.2 kg)  02/25/23 199 lb (90.3 kg)  09/07/22 204 lb 3.2 oz (92.6 kg)     GEN:  Well nourished, well developed in no acute distress HEENT: Normal NECK: No JVD; No carotid bruits LYMPHATICS: No lymphadenopathy CARDIAC: RRR, no murmurs, rubs,  gallops RESPIRATORY:  Clear to auscultation without rales, wheezing or rhonchi  ABDOMEN: Soft, non-tender, non-distended MUSCULOSKELETAL:  No edema; No deformity  SKIN: Warm and dry NEUROLOGIC:  Alert and oriented x 3 PSYCHIATRIC:  Normal affect    Signed, Norman Herrlich, MD  04/12/2023 12:56 PM    [facility]

## 2023-04-12 ENCOUNTER — Encounter: Payer: Self-pay | Admitting: Cardiology

## 2023-04-12 ENCOUNTER — Ambulatory Visit: Attending: Cardiology | Admitting: Cardiology

## 2023-04-12 VITALS — BP 134/76 | Ht 64.0 in | Wt 201.0 lb

## 2023-04-12 DIAGNOSIS — I10 Essential (primary) hypertension: Secondary | ICD-10-CM | POA: Diagnosis present

## 2023-04-12 DIAGNOSIS — I2510 Atherosclerotic heart disease of native coronary artery without angina pectoris: Secondary | ICD-10-CM | POA: Insufficient documentation

## 2023-04-12 DIAGNOSIS — I2584 Coronary atherosclerosis due to calcified coronary lesion: Secondary | ICD-10-CM | POA: Insufficient documentation

## 2023-04-12 DIAGNOSIS — E782 Mixed hyperlipidemia: Secondary | ICD-10-CM | POA: Diagnosis present

## 2023-04-12 DIAGNOSIS — R931 Abnormal findings on diagnostic imaging of heart and coronary circulation: Secondary | ICD-10-CM | POA: Diagnosis present

## 2023-04-12 DIAGNOSIS — J449 Chronic obstructive pulmonary disease, unspecified: Secondary | ICD-10-CM | POA: Insufficient documentation

## 2023-04-12 MED ORDER — CLONIDINE HCL 0.1 MG PO TABS: 0.1000 mg | ORAL_TABLET | Freq: Two times a day (BID) | ORAL | 3 refills | Status: AC

## 2023-04-12 MED ORDER — EZETIMIBE 10 MG PO TABS: 10.0000 mg | ORAL_TABLET | Freq: Every day | ORAL | 3 refills | Status: AC

## 2023-04-12 NOTE — Patient Instructions (Signed)

## 2023-04-24 LAB — COMPREHENSIVE METABOLIC PANEL WITH GFR: EGFR: 82

## 2023-05-10 ENCOUNTER — Telehealth: Payer: Self-pay | Admitting: Cardiology

## 2023-05-10 NOTE — Telephone Encounter (Signed)
 Left message for the patient to call back.

## 2023-05-20 NOTE — Telephone Encounter (Signed)
Recommendations reviewed with pt as per [facility] note.  Pt verbalized understanding and had no additional questions.

## 2023-05-27 ENCOUNTER — Telehealth: Payer: Self-pay | Admitting: Cardiology

## 2023-05-27 NOTE — Telephone Encounter (Signed)
 Pt c/o Shortness Of Breath: STAT if SOB developed within the last 24 hours or pt is noticeably SOB on the phone  1. Are you currently SOB (can you hear that pt is SOB on the phone)?  No   2. How long have you been experiencing SOB?  About 1 month   3. Are you SOB when sitting or when up moving around? When up and moving around  4. Are you currently experiencing any other symptoms?  Chest pain yesterday + sometimes with exertion, but no CP today

## 2023-05-27 NOTE — Telephone Encounter (Signed)
 Called patient and she reported that she has been having shortness of breath with any exertion and yesterday it caused her to have chest pain that was relieved with 2 nitroglycerin . She stated that she has a history of COPD and she was short of breath about a week ago and she used her inhalers but they did not help her. She then doubled up on her diuretic and she lost 12 lbs of fluid. She is currently still short of breath with any exertion and having chest pain when she exerts herself. She currently is not having chest pain at this time.

## 2023-05-28 NOTE — Telephone Encounter (Signed)
 Left message for the patient to call back.

## 2023-05-29 ENCOUNTER — Other Ambulatory Visit: Payer: Self-pay

## 2023-05-29 DIAGNOSIS — I5032 Chronic diastolic (congestive) heart failure: Secondary | ICD-10-CM

## 2023-05-29 MED ORDER — ISOSORBIDE MONONITRATE ER 60 MG PO TB24: 60.0000 mg | ORAL_TABLET | Freq: Every day | ORAL | 3 refills | Status: AC

## 2023-05-29 NOTE — Telephone Encounter (Signed)
 Called the patient and informed her of Dr. Tonja Fray recommendation below:  "Double the dose of Imdur  from 30 mg daily to 60 mg daily, asked her to have proBNP and Chem-7 done"  Patient verbalized understanding and had no further questions at this time. Imdur  medication ordered via Epic and sent to her pharmacy. Labs ordered via Epic.

## 2023-05-31 LAB — BASIC METABOLIC PANEL WITH GFR
BUN/Creatinine Ratio: 11 — ABNORMAL LOW (ref 12–28)
BUN: 8 mg/dL (ref 8–27)
CO2: 24 mmol/L (ref 20–29)
Calcium: 8.9 mg/dL (ref 8.7–10.3)
Chloride: 106 mmol/L (ref 96–106)
Creatinine, Ser: 0.71 mg/dL (ref 0.57–1.00)
Glucose: 86 mg/dL (ref 70–99)
Potassium: 4.1 mmol/L (ref 3.5–5.2)
Sodium: 143 mmol/L (ref 134–144)
eGFR: 92 mL/min/{1.73_m2} (ref >59)

## 2023-05-31 LAB — PRO B NATRIURETIC PEPTIDE: NT-Pro BNP: 65 pg/mL (ref 0–301)

## 2023-06-05 ENCOUNTER — Telehealth: Payer: Self-pay

## 2023-06-05 NOTE — Telephone Encounter (Signed)
 Lab Results reviewed with pt as per Dr. Vanetta Shawl note.  Pt verbalized understanding and had no additional questions. Routed to PCP

## 2024-01-31 ENCOUNTER — Telehealth: Payer: Self-pay | Admitting: Cardiology

## 2024-01-31 NOTE — Telephone Encounter (Signed)
 Pt requesting an appointment sooner than 03/2024 due to leg swelling. Appointment made.

## 2024-01-31 NOTE — Telephone Encounter (Signed)
 Pt c/o swelling/edema: STAT if pt has developed SOB within 24 hours  If swelling, where is the swelling located? Feet/legs  How much weight have you gained and in what time span? Not sure  Have you gained 2 pounds in a day or 5 pounds in a week? Yes   Do you have a log of your daily weights (if so, list)?  no  Are you currently taking a fluid pill? yes  Are you currently SOB? no  Have you traveled recently in a car or plane for an extended period of time? No  Pt she does not have a log but knows her regular weight and can tll she has gained weight. Please advise.

## 2024-02-15 NOTE — Progress Notes (Unsigned)
 " Cardiology Office Note:    Date:  02/15/2024   ID:  [name], DOB [birth year], MRN [id number]  PCP:  Stephanie Charlene CROME, MD  Cardiologist:  Redell Leiter, MD    Referring MD: Stephanie Charlene CROME, MD    ASSESSMENT:    No diagnosis found. PLAN:    In order of problems listed above:  ***   Next appointment: ***   Medication Adjustments/Labs and Tests Ordered: Current medicines are reviewed at length with the patient today.  Concerns regarding medicines are outlined above.  No orders of the defined types were placed in this encounter.  No orders of the defined types were placed in this encounter.    History of Present Illness:    [name] is a 69 y.o. female with a hx of CAD with a very high coronary calcium  score 688/96 percentile moderate stenosis in the mid left anterior descending coronary artery hypertension hyperlipidemia and COPD last seen 04/12/2023.  With complaints of edema she had an N-terminal proBNP level quite low at 65 and the rule out heart failure range and her echocardiogram January 2025 showed mild LVH normal systolic function.  Recent lipid profile 11/19/2020 cholesterol 129 LDL 66 non-HDL cholesterol 90 sodium 853 potassium 3.5 GFR 76 cc/min.  She has been taking both calcium  channel blocker and gabapentin both associated with sodium retention and edema. Compliance with diet, lifestyle and medications: *** Past Medical History:  Diagnosis Date   Allergy    sinus problems   Bilateral radial fractures    Dr. Vola is managing   CAD (coronary artery disease)    Chest pain 09/01/2021   Chronic respiratory failure with hypoxia (HCC)    Colonic ischemia 03/21/2013   COPD (chronic obstructive pulmonary disease) (HCC)    Depression with anxiety    Dysuria 04/25/2021   Emphysema of lung (HCC)    GERD (gastroesophageal reflux disease)    GI bleeding    Heart murmur    Dr. Leiter   Hyperlipidemia    Hypertension, essential, benign     Insomnia    Lumbar radicular pain    Obesity    Osteoarthritis of right hip    Osteoporosis    Pneumonia    hx of pneumonia after surgery   PONV (postoperative nausea and vomiting)    Prolonged QT interval 09/01/2021   Retention of urine 04/25/2021   Sleep apnea    CPAP   Spondylolisthesis of lumbar region 12/17/2016   Umbilical hernia    Vitamin D deficiency     Current Medications: Active Medications[1]    EKGs/Labs/Other Studies Reviewed:    The following studies were reviewed today:  Cardiac Studies & Procedures   ______________________________________________________________________________________________     ECHOCARDIOGRAM  ECHOCARDIOGRAM COMPLETE 02/27/2023  Narrative ECHOCARDIOGRAM REPORT    Patient Name:   [name] Date of Exam: 02/27/2023 Medical Rec #:  [id number]           Height:       64.0 in Accession #:    [id number]          Weight:       199.0 lb Date of Birth:  July 31, 1955           BSA:          1.952 m Patient Age:    67 years            BP:           130/76 mmHg Patient  Gender: F                   HR:           57 bpm. Exam Location:  [place]  Procedure: 2D Echo, Cardiac Doppler, Color Doppler and Strain Analysis  Indications:    Chronic respiratory failure with hypoxia (HCC) [G03.88 (ICD-10-CM)]; Hypertensive heart disease with chronic diastolic congestive heart failure (HCC) [I11.0, I50.32 (ICD-10-CM)]; Coronary artery disease due to calcified coronary lesion [I25.10, I25.84 (ICD-10-CM)]; Hypertension, essential, benign [I10 (ICD-10-CM)]; Dyspnea, unspecified type [R06.00 (ICD-10-CM)]  History:        Patient has prior history of Echocardiogram examinations, most recent 09/02/2021. CHF, CAD, COPD, Signs/Symptoms:Dyspnea and Edema; Risk Factors:Hypertension and Dyslipidemia.  Sonographer:    Charlie Jointer RDCS Referring Phys: [phone] DELON JAYSON HOOVER   Sonographer Comments: Suboptimal parasternal  window. IMPRESSIONS   1. Left ventricular ejection fraction, by estimation, is 60 to 65%. The left ventricle has normal function. The left ventricle has no regional wall motion abnormalities. There is mild left ventricular hypertrophy. Left ventricular diastolic parameters are consistent with Grade II diastolic dysfunction (pseudonormalization). The average left ventricular global longitudinal strain is -15.8 %. The global longitudinal strain is abnormal. 2. Right ventricular systolic function is normal. The right ventricular size is normal. There is normal pulmonary artery systolic pressure. 3. The mitral valve is normal in structure. Mild mitral valve regurgitation. No evidence of mitral stenosis. 4. The aortic valve is normal in structure. Aortic valve regurgitation is not visualized. No aortic stenosis is present. 5. The inferior vena cava is normal in size with greater than 50% respiratory variability, suggesting right atrial pressure of 3 mmHg.  FINDINGS Left Ventricle: Left ventricular ejection fraction, by estimation, is 60 to 65%. The left ventricle has normal function. The left ventricle has no regional wall motion abnormalities. The average left ventricular global longitudinal strain is -15.8 %. The global longitudinal strain is abnormal. The left ventricular internal cavity size was normal in size. There is mild left ventricular hypertrophy. Left ventricular diastolic parameters are consistent with Grade II diastolic dysfunction (pseudonormalization).  Right Ventricle: The right ventricular size is normal. No increase in right ventricular wall thickness. Right ventricular systolic function is normal. There is normal pulmonary artery systolic pressure. The tricuspid regurgitant velocity is 1.82 m/s, and with an assumed right atrial pressure of 3 mmHg, the estimated right ventricular systolic pressure is 16.2 mmHg.  Left Atrium: Left atrial size was normal in size.  Right Atrium: Right  atrial size was normal in size.  Pericardium: There is no evidence of pericardial effusion.  Mitral Valve: The mitral valve is normal in structure. Mild mitral valve regurgitation. No evidence of mitral valve stenosis.  Tricuspid Valve: The tricuspid valve is normal in structure. Tricuspid valve regurgitation is not demonstrated. No evidence of tricuspid stenosis.  Aortic Valve: The aortic valve is normal in structure. Aortic valve regurgitation is not visualized. No aortic stenosis is present.  Pulmonic Valve: The pulmonic valve was normal in structure. Pulmonic valve regurgitation is not visualized. No evidence of pulmonic stenosis.  Aorta: The aortic root is normal in size and structure.  Venous: The inferior vena cava is normal in size with greater than 50% respiratory variability, suggesting right atrial pressure of 3 mmHg.  IAS/Shunts: No atrial level shunt detected by color flow Doppler.   LEFT VENTRICLE PLAX 2D LVIDd:         4.20 cm   Diastology LVIDs:  2.90 cm   LV e' medial:    5.44 cm/s LV PW:         1.10 cm   LV E/e' medial:  16.6 LV IVS:        1.30 cm   LV e' lateral:   9.14 cm/s LVOT diam:     2.00 cm   LV E/e' lateral: 9.9 LV SV:         85 LV SV Index:   44        2D Longitudinal Strain LVOT Area:     3.14 cm  2D Strain GLS Avg:     -15.8 %   RIGHT VENTRICLE             IVC RV Basal diam:  3.10 cm     IVC diam: 1.70 cm RV Mid diam:    2.70 cm RV S prime:     15.10 cm/s TAPSE (M-mode): 2.6 cm  LEFT ATRIUM             Index        RIGHT ATRIUM           Index LA diam:        3.60 cm 1.84 cm/m   RA Area:     15.40 cm LA Vol (A2C):   53.5 ml 27.40 ml/m  RA Volume:   38.10 ml  19.52 ml/m LA Vol (A4C):   46.2 ml 23.67 ml/m LA Biplane Vol: 51.7 ml 26.48 ml/m AORTIC VALVE LVOT Vmax:   125.50 cm/s LVOT Vmean:  74.900 cm/s LVOT VTI:    0.272 m  AORTA Ao Root diam: 3.50 cm Ao Asc diam:  3.60 cm Ao Desc diam: 2.60 cm  MITRAL VALVE                   TRICUSPID VALVE MV Area (PHT): 3.81 cm       TR Peak grad:   13.2 mmHg MV Decel Time: 199 msec       TR Vmax:        182.00 cm/s MR Peak grad:    67.9 mmHg MR Vmax:         412.00 cm/s  SHUNTS MR PISA:         1.27 cm     Systemic VTI:  0.27 m MR PISA Eff ROA: 12 mm       Systemic Diam: 2.00 cm MR PISA Radius:  0.45 cm MV E velocity: 90.10 cm/s MV A velocity: 92.00 cm/s MV E/A ratio:  0.98  Lamar Fitch MD Electronically signed by Lamar Fitch MD Signature Date/Time: 02/27/2023/5:34:01 PM    Final      CT SCANS  CT CORONARY FRACTIONAL FLOW RESERVE DATA PREP 01/03/2021  Narrative EXAM: FFRCT ANALYSIS  FINDINGS: FFRct analysis was performed on the original cardiac CT angiogram dataset. Diagrammatic representation of the FFRct analysis is provided in a separate PDF document in PACS. This dictation was created using the PDF document and an interactive 3D model of the results. 3D model is not available in the EMR/PACS. Normal FFR range is >0.80.  1. LM: 99  2. LAD: Prox 0.97, mid 0.92, distal 0.86  3. LCX: Prox 0.98, mid 0.97, distal 0.96  4. RCA: Prox 0.99, mid 0.98, distal 0.98  IMPRESSION: No hemodynamically significant lesion identified.  Mid LAD lesion in question has low probability for hemodynamically significant stenosis.  Lamar JINNY Fitch, MD   Electronically Signed By: Lamar Fitch M.D. On:  01/03/2021 15:53   CT SCANS  CT CORONARY MORPH W/CTA COR W/SCORE 01/02/2021  Addendum 01/03/2021 12:47 PM ADDENDUM REPORT: 01/03/2021 12:45  CLINICAL DATA:  Chest pain  EXAM: Cardiac/Coronary  CTA  TECHNIQUE: The patient was scanned on a [employer].  FINDINGS: A 120 kV prospective scan was triggered in the descending thoracic aorta at 111 HU's. Axial non-contrast 3 mm slices were carried out through the heart. The data set was analyzed on a dedicated work station and scored using the Agatson method. Gantry rotation  speed was 250 msecs and collimation was .6 mm. No beta blockade and 0.8 mg of sl NTG was given. The 3D data set was reconstructed in 5% intervals of the 67-82 % of the R-R cycle. Diastolic phases were analyzed on a dedicated work station using MPR, MIP and VRT modes. The patient received 80 cc of contrast.  Aorta: Normal size. Moderate calcifications in ascending and descending aorta. No dissection.  Aortic Valve:  Trileaflet.  No calcifications.  Coronary Arteries:  Normal coronary origin.  Right dominance.  RCA is a large dominant artery that gives rise to PDA and PLA. There are multiple small, calcified, non-obstructive (0-25% stenosis) plaques noted in proximal, mid and distal portion of this artery.  Left main is a large artery that gives rise to LAD and LCX arteries. There is minimal, calcified plaque (0-25%) stenosis at the ostium and mid portion of this artery.  LAD is a large vessel that has multiple calcified, moderate plaques (50-75% stenosis) plaques in its pr mid portion. Proximal and distal portion of this artery has minimal, non-obstructive, calcified (0-25% stenosis) plaques. This artery gives rise to small D1 and small D2.  LCX is a non-dominant artery that gives rise to moderate size OM1 and large OM2 branch. LCX is diffusely diseased with minimal, non-obstructive calcified plaques of 0-25% stenosis.  Other findings:  Normal pulmonary vein drainage into the left atrium.  Normal left atrial appendage without a thrombus.  Normal size of the pulmonary artery.  IMPRESSION: 1. Coronary calcium  score of 688. This was 17 percentile for age and sex matched control.  2. Normal coronary origin with right dominance.  3. CAD-RADS 3. Moderate stenosis (mid LAD). Consider symptom-guided anti-ischemic pharmacotherapy as well as risk factor modification per guideline directed care. Additional analysis with CT FFR will be submitted.  4. Pictures will be submitted  for FFR analysis of mid LAD stenosis.  Lamar JINNY Fitch, MD   Electronically Signed By: Lamar Fitch M.D. On: 01/03/2021 12:45  Narrative EXAM: OVER-READ INTERPRETATION  CT CHEST  The following report is an over-read performed by radiologist Dr. Toribio Aye of [facility] on 01/02/2021. This over-read does not include interpretation of cardiac or coronary anatomy or pathology. The coronary calcium  score/coronary CTA interpretation by the cardiologist is attached.  COMPARISON:  None.  FINDINGS: Atherosclerotic calcifications throughout the thoracic aorta. Within the visualized portions of the thorax there are no suspicious appearing pulmonary nodules or masses, there is no acute consolidative airspace disease, no pleural effusions, no pneumothorax and no lymphadenopathy. Visualized portions of the upper abdomen demonstrates diffuse low attenuation throughout the visualized hepatic parenchyma, indicative of hepatic steatosis. There are no aggressive appearing lytic or blastic lesions noted in the visualized portions of the skeleton.  IMPRESSION: 1.  Aortic Atherosclerosis (ICD10-I70.0). 2. Hepatic steatosis.  Electronically Signed: By: Toribio Aye M.D. On: 01/02/2021 16:01     ______________________________________________________________________________________________          Recent Labs:  05/30/2023: BUN 8; Creatinine, Ser 0.71; NT-Pro BNP 65; Potassium 4.1; Sodium 143  Recent Lipid Panel    Component Value Date/Time   CHOL 161 09/02/2021 0125   TRIG 113 09/02/2021 0125   HDL 34 (L) 09/02/2021 0125   CHOLHDL 4.7 09/02/2021 0125   VLDL 23 09/02/2021 0125   LDLCALC 104 (H) 09/02/2021 0125    Physical Exam:    VS:  There were no vitals taken for this visit.    Wt Readings from Last 3 Encounters:  04/12/23 201 lb (91.2 kg)  02/25/23 199 lb (90.3 kg)  09/07/22 204 lb 3.2 oz (92.6 kg)     GEN: *** Well nourished, well  developed in no acute distress HEENT: Normal NECK: No JVD; No carotid bruits LYMPHATICS: No lymphadenopathy CARDIAC: ***RRR, no murmurs, rubs, gallops RESPIRATORY:  Clear to auscultation without rales, wheezing or rhonchi  ABDOMEN: Soft, non-tender, non-distended MUSCULOSKELETAL:  No edema; No deformity  SKIN: Warm and dry NEUROLOGIC:  Alert and oriented x 3 PSYCHIATRIC:  Normal affect    Signed, Redell Leiter, MD  02/15/2024 9:51 AM    [facility]     [1]  No outpatient medications have been marked as taking for the 02/17/24 encounter (Appointment) with Leiter Redell PARAS, MD.   "

## 2024-02-17 ENCOUNTER — Ambulatory Visit: Attending: Cardiology | Admitting: Cardiology

## 2024-02-17 DIAGNOSIS — R600 Localized edema: Secondary | ICD-10-CM

## 2024-02-17 DIAGNOSIS — E782 Mixed hyperlipidemia: Secondary | ICD-10-CM

## 2024-02-17 DIAGNOSIS — I2510 Atherosclerotic heart disease of native coronary artery without angina pectoris: Secondary | ICD-10-CM

## 2024-02-17 DIAGNOSIS — I119 Hypertensive heart disease without heart failure: Secondary | ICD-10-CM

## 2024-02-17 DIAGNOSIS — R931 Abnormal findings on diagnostic imaging of heart and coronary circulation: Secondary | ICD-10-CM

## 2024-02-17 DIAGNOSIS — J449 Chronic obstructive pulmonary disease, unspecified: Secondary | ICD-10-CM
